# Patient Record
Sex: Male | Born: 1980 | Race: White | Hispanic: No | Marital: Single | Smoking: Current every day smoker
Health system: Southern US, Community
[De-identification: ages and names within clinical notes are randomized; demographics above are authoritative.]

## PROBLEM LIST (undated history)

## (undated) DIAGNOSIS — S322XXA Fracture of coccyx, initial encounter for closed fracture: Secondary | ICD-10-CM

---

## 2000-01-27 ENCOUNTER — Emergency Department (HOSPITAL_COMMUNITY): Admission: EM | Admit: 2000-01-27 | Discharge: 2000-01-27 | Payer: Self-pay | Admitting: Emergency Medicine

## 2001-01-21 ENCOUNTER — Emergency Department (HOSPITAL_COMMUNITY): Admission: EM | Admit: 2001-01-21 | Discharge: 2001-01-21 | Payer: Self-pay | Admitting: Emergency Medicine

## 2001-01-21 ENCOUNTER — Encounter: Payer: Self-pay | Admitting: Emergency Medicine

## 2002-09-19 ENCOUNTER — Encounter: Payer: Self-pay | Admitting: Emergency Medicine

## 2002-09-19 ENCOUNTER — Emergency Department (HOSPITAL_COMMUNITY): Admission: EM | Admit: 2002-09-19 | Discharge: 2002-09-19 | Payer: Self-pay

## 2003-04-10 ENCOUNTER — Emergency Department (HOSPITAL_COMMUNITY): Admission: EM | Admit: 2003-04-10 | Discharge: 2003-04-10 | Payer: Self-pay | Admitting: Emergency Medicine

## 2003-12-04 ENCOUNTER — Emergency Department (HOSPITAL_COMMUNITY): Admission: EM | Admit: 2003-12-04 | Discharge: 2003-12-04 | Payer: Self-pay | Admitting: Emergency Medicine

## 2004-04-26 ENCOUNTER — Emergency Department (HOSPITAL_COMMUNITY): Admission: AD | Admit: 2004-04-26 | Discharge: 2004-04-26 | Payer: Self-pay | Admitting: Family Medicine

## 2004-10-05 ENCOUNTER — Emergency Department (HOSPITAL_COMMUNITY): Admission: EM | Admit: 2004-10-05 | Discharge: 2004-10-05 | Payer: Self-pay | Admitting: Emergency Medicine

## 2004-12-23 ENCOUNTER — Emergency Department (HOSPITAL_COMMUNITY): Admission: EM | Admit: 2004-12-23 | Discharge: 2004-12-23 | Payer: Self-pay | Admitting: Emergency Medicine

## 2005-04-21 ENCOUNTER — Emergency Department (HOSPITAL_COMMUNITY): Admission: EM | Admit: 2005-04-21 | Discharge: 2005-04-21 | Payer: Self-pay | Admitting: Family Medicine

## 2005-09-15 ENCOUNTER — Emergency Department (HOSPITAL_COMMUNITY): Admission: EM | Admit: 2005-09-15 | Discharge: 2005-09-15 | Payer: Self-pay | Admitting: Family Medicine

## 2007-12-24 ENCOUNTER — Emergency Department (HOSPITAL_COMMUNITY): Admission: EM | Admit: 2007-12-24 | Discharge: 2007-12-24 | Payer: Self-pay | Admitting: Emergency Medicine

## 2008-02-02 ENCOUNTER — Emergency Department (HOSPITAL_COMMUNITY): Admission: EM | Admit: 2008-02-02 | Discharge: 2008-02-03 | Payer: Self-pay | Admitting: Emergency Medicine

## 2010-02-05 ENCOUNTER — Emergency Department (HOSPITAL_COMMUNITY): Admission: EM | Admit: 2010-02-05 | Discharge: 2010-02-05 | Payer: Self-pay | Admitting: Emergency Medicine

## 2011-04-11 LAB — CBC
HCT: 45.3
Hemoglobin: 15.5
MCHC: 34.2
MCV: 89.6
RBC: 5.05

## 2011-04-11 LAB — DIFFERENTIAL
Basophils Relative: 1
Eosinophils Absolute: 0.4
Eosinophils Relative: 3
Monocytes Absolute: 1.1 — ABNORMAL HIGH
Monocytes Relative: 8

## 2011-04-11 LAB — POCT I-STAT, CHEM 8
Calcium, Ion: 1.22
Glucose, Bld: 88
HCT: 48
Hemoglobin: 16.3

## 2011-07-19 ENCOUNTER — Emergency Department (INDEPENDENT_AMBULATORY_CARE_PROVIDER_SITE_OTHER)
Admission: EM | Admit: 2011-07-19 | Discharge: 2011-07-19 | Disposition: A | Discharge status: Home / Self Care | Payer: Self-pay | Source: Home / Self Care | Attending: Emergency Medicine | Admitting: Emergency Medicine

## 2011-07-19 ENCOUNTER — Encounter (HOSPITAL_COMMUNITY): Payer: Self-pay | Admitting: Emergency Medicine

## 2011-07-19 ENCOUNTER — Emergency Department (INDEPENDENT_AMBULATORY_CARE_PROVIDER_SITE_OTHER): Payer: Self-pay

## 2011-07-19 DIAGNOSIS — S93409A Sprain of unspecified ligament of unspecified ankle, initial encounter: Secondary | ICD-10-CM

## 2011-07-19 DIAGNOSIS — S93401A Sprain of unspecified ligament of right ankle, initial encounter: Secondary | ICD-10-CM

## 2011-07-19 MED ORDER — OXYCODONE-ACETAMINOPHEN 5-325 MG PO TABS: 1.0000 | ORAL_TABLET | Freq: Once | ORAL | Status: DC

## 2011-07-19 MED ORDER — OXYCODONE-ACETAMINOPHEN 5-325 MG PO TABS
2.0000 | ORAL_TABLET | ORAL | Status: AC | PRN
Start: 2011-07-19 — End: 2011-07-29

## 2011-07-19 NOTE — ED Notes (Signed)
Per pt injured right ankle last night going down steps missed step ankle rolled - swelling and increased pain - unable to put wt on foot - mild abrasion anterior ankle

## 2011-07-19 NOTE — ED Provider Notes (Signed)
History     CSN: [id number]  Arrival date & time 07/19/11  4098   First MD Initiated Contact with Patient 07/19/11 1007      Chief Complaint  Patient presents with  . Ankle Pain    (Consider location/radiation/quality/duration/timing/severity/associated sxs/prior treatment) HPI Comments: Derek Scott injured his right ankle last night. He twisted it. He did not hear a pop, but it immediately swelled up he's been unable to bear weight since then. He has pain and swelling over the lateral malleolus. He notes some numbness and tingling over the dorsum of the foot. No muscle weakness.  Patient is a 31 y.o. male presenting with ankle pain.  Ankle Pain  Pertinent negatives include no numbness.    History reviewed. No pertinent past medical history.  History reviewed. No pertinent past surgical history.  History reviewed. No pertinent family history.  History  Substance Use Topics  . Smoking status: Current Everyday Smoker  . Smokeless tobacco: Not on file  . Alcohol Use: No      Review of Systems  Musculoskeletal: Positive for joint swelling and arthralgias. Negative for myalgias, back pain and gait problem.  Skin: Negative for rash and wound.  Neurological: Negative for weakness and numbness.    Allergies  Hydrocodone  Home Medications   Current Outpatient Rx  Name Route Sig Dispense Refill  . OXYCODONE-ACETAMINOPHEN 5-325 MG PO TABS Oral Take 2 tablets by mouth every 4 (four) hours as needed for pain. 20 tablet 0    BP 137/86  Pulse 73  Temp(Src) 97.5 F (36.4 C) (Oral)  Resp 20  SpO2 100%  Physical Exam  Nursing note and vitals reviewed. Constitutional: He is oriented to person, place, and time. He appears well-developed and well-nourished. No distress.  Musculoskeletal: Normal range of motion. He exhibits edema and tenderness.       Exam of the ankle reveals swelling and exquisite tenderness to palpation over the lateral malleolus. There is no obvious deformity  or bruising. The ankle has very limited range of motion with pain. Sign was negative. Pulses were full. Good capillary refill. Normal strength and sensation.  Neurological: He is alert and oriented to person, place, and time. He has normal strength and normal reflexes. He displays no atrophy. No sensory deficit. He exhibits normal muscle tone.  Skin: Skin is warm and dry. No rash noted. He is not diaphoretic.    ED Course  Procedures (including critical care time)  Labs Reviewed - No data to display Dg Ankle Complete Right  07/19/2011  *RADIOLOGY REPORT*  Clinical Data: Severe right ankle pain/injury  RIGHT ANKLE - COMPLETE 3+ VIEW  Comparison: None.  Findings: No fracture or dislocation is seen.  The ankle mortise is intact.  The base of the fifth metatarsal is unremarkable.  Moderate lateral soft tissue swelling.  IMPRESSION: No fracture or dislocation is seen.  Moderate lateral soft tissue swelling.  Original Report Authenticated By: Charline Bills, M.D.     1. Sprain of right ankle       MDM  The patient has an ankle sprain. He was placed in an ASO and given crutches. He was told to followup with an orthopedist if no better in 2 weeks.        Roque Lias, MD 07/19/11 [phone]

## 2011-11-10 ENCOUNTER — Emergency Department (HOSPITAL_COMMUNITY): Payer: Medicaid Other

## 2011-11-10 ENCOUNTER — Emergency Department (HOSPITAL_COMMUNITY)
Admission: EM | Admit: 2011-11-10 | Discharge: 2011-11-10 | Disposition: A | Discharge status: Home / Self Care | Payer: Medicaid Other | Attending: Emergency Medicine | Admitting: Emergency Medicine

## 2011-11-10 ENCOUNTER — Encounter (HOSPITAL_COMMUNITY): Payer: Self-pay | Admitting: Emergency Medicine

## 2011-11-10 DIAGNOSIS — M25579 Pain in unspecified ankle and joints of unspecified foot: Secondary | ICD-10-CM | POA: Insufficient documentation

## 2011-11-10 DIAGNOSIS — S93409A Sprain of unspecified ligament of unspecified ankle, initial encounter: Secondary | ICD-10-CM | POA: Insufficient documentation

## 2011-11-10 DIAGNOSIS — IMO0001 Reserved for inherently not codable concepts without codable children: Secondary | ICD-10-CM | POA: Insufficient documentation

## 2011-11-10 DIAGNOSIS — X58XXXA Exposure to other specified factors, initial encounter: Secondary | ICD-10-CM | POA: Insufficient documentation

## 2011-11-10 DIAGNOSIS — F17200 Nicotine dependence, unspecified, uncomplicated: Secondary | ICD-10-CM | POA: Insufficient documentation

## 2011-11-10 HISTORY — DX: Fracture of coccyx, initial encounter for closed fracture: S32.2XXA

## 2011-11-10 MED ORDER — OXYCODONE-ACETAMINOPHEN 5-325 MG PO TABS
1.0000 | ORAL_TABLET | Freq: Four times a day (QID) | ORAL | Status: AC | PRN
Start: 2011-11-10 — End: 2011-11-20

## 2011-11-10 MED ORDER — IBUPROFEN 800 MG PO TABS: 800.0000 mg | ORAL_TABLET | Freq: Three times a day (TID) | ORAL | Status: AC | PRN

## 2011-11-10 NOTE — ED Notes (Signed)
Pt reports job related injury 3 months. R/ankle"swollen at time of injury"." No xrays done". pain and intermittent swelling persist

## 2011-11-10 NOTE — Discharge Instructions (Signed)
Your x-rays were normal. Return here as needed. Ice and heat to your ankle. You still need to see Dr. Madelon Lips in follow up about your ankle.

## 2011-11-10 NOTE — Progress Notes (Signed)
Pt listed as self pay with no insurance coverage Pt confirms he is self pay [place] resident CM and [facility] community liaison spoke with him Pt offered [facility] services to assist with finding a [place] self pay provider Pt pending medicaid

## 2011-11-10 NOTE — ED Provider Notes (Signed)
History     CSN: [id number]  Arrival date & time 11/10/11  1106   First MD Initiated Contact with Patient 11/10/11 1127      Chief Complaint  Patient presents with  . Ankle Pain    r/ ankle pain and swelling 3 months after injury    (Consider location/radiation/quality/duration/timing/severity/associated sxs/prior treatment) HPI  The patient presents to the ER with continued ankle pain from an injury 3 months ago. The patient states that he was seen at [facility] and told he had a sprain. 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 The patient denies weakness, numbness, or ambulation issues. Past Medical History  Diagnosis Date  . Fractured coccyx     4 years ago    History reviewed. No pertinent past surgical history.  Family History  Problem Relation Age of Onset  . Hypertension Mother   . Diabetes Mother   . Cancer Mother     History  Substance Use Topics  . Smoking status: Current Everyday Smoker    Types: Cigarettes  . Smokeless tobacco: Not on file  . Alcohol Use: No      Review of Systems All other systems negative except as documented in the HPI. All pertinent positives and negatives as reviewed in the HPI.   Allergies  Hydrocodone  Home Medications   Current Outpatient Rx  Name Route Sig Dispense Refill  . ACETAMINOPHEN 325 MG PO TABS Oral Take 975 mg by mouth every 6 (six) hours as needed. For pain.    Marlin Canary HEADACHE PO Oral Take 3-4 packets by mouth every 2 (two) hours as needed. For pain.    . ADULT MULTIVITAMIN W/MINERALS CH Oral Take 1 tablet by mouth daily.      BP 131/89  Pulse 100  Temp(Src) 98 F (36.7 C) (Oral)  Resp 16  SpO2 100%  Physical Exam Physical Examination: General appearance - alert, well appearing, and in no distress, oriented to person, place, and time and normal appearing weight Chest - clear to auscultation, no  wheezes, rales or rhonchi, symmetric air entry Heart - normal rate, regular rhythm, normal S1, S2, no murmurs, rubs, clicks or gallops Musculoskeletal - abnormal exam of right ankle with pain with movement and palpation. There is no swelling noted to the ankle. Patient has normal sensation and strength in the ankle and foot. Skin - normal coloration and turgor, no rashes, no suspicious skin lesions noted  ED Course  Procedures (including critical care time)  Labs Reviewed - No data to display Dg Ankle Complete Right  11/10/2011  *RADIOLOGY REPORT*  Clinical Data: Lateral ankle pain.  RIGHT ANKLE - COMPLETE 3+ VIEW  Comparison: 07/19/2011  Findings: No acute bony abnormality.  Specifically, no fracture, subluxation, or dislocation.  Soft tissues are intact.  IMPRESSION: No acute bony abnormality.  Original Report Authenticated By: Cyndie Chime, M.D.    The patient has an ortho appointment in the next 10 days. Told to ice and elevate. Return here as needed.     MDM  MDM Reviewed: nursing note and vitals Interpretation: x-ray            Carlyle Dolly, PA-C 11/10/11 1249

## 2011-11-10 NOTE — ED Provider Notes (Signed)
Medical screening examination/treatment/procedure(s) were performed by non-physician practitioner and as supervising physician I was immediately available for consultation/collaboration.  Guled Gahan, MD 11/10/11 1554 

## 2011-12-10 ENCOUNTER — Encounter (HOSPITAL_COMMUNITY): Payer: Self-pay | Admitting: Emergency Medicine

## 2011-12-10 ENCOUNTER — Emergency Department (HOSPITAL_COMMUNITY)
Admission: EM | Admit: 2011-12-10 | Discharge: 2011-12-11 | Disposition: A | Discharge status: Home / Self Care | Payer: Medicaid Other | Attending: Emergency Medicine | Admitting: Emergency Medicine

## 2011-12-10 DIAGNOSIS — F17200 Nicotine dependence, unspecified, uncomplicated: Secondary | ICD-10-CM | POA: Insufficient documentation

## 2011-12-10 DIAGNOSIS — M25579 Pain in unspecified ankle and joints of unspecified foot: Secondary | ICD-10-CM | POA: Insufficient documentation

## 2011-12-10 DIAGNOSIS — K1379 Other lesions of oral mucosa: Secondary | ICD-10-CM

## 2011-12-10 DIAGNOSIS — G8929 Other chronic pain: Secondary | ICD-10-CM

## 2011-12-10 NOTE — ED Notes (Signed)
Pt states he had an injury to his foot 3 mths ago and was seen here and received a referral to [facility] to Dr Madelon Lips but they would not accept the referral as it has to be from a primary care doctor and pt does not have one  Pt states he was seen at [facility] a couple days ago and was told he had a saliva gland blocked and pt is still having pain and would like that rechecked as well

## 2011-12-10 NOTE — ED Provider Notes (Signed)
History     CSN: [id number]  Arrival date & time 12/10/11  2041   First MD Initiated Contact with Patient 12/10/11 2329      Chief Complaint  Patient presents with  . Foot Pain    (Consider location/radiation/quality/duration/timing/severity/associated sxs/prior treatment) Patient is a 31 y.o. male presenting with lower extremity pain. The history is provided by the patient. No language interpreter was used.  Foot Pain This is a chronic problem. The current episode started more than 1 month ago. The problem occurs constantly. The problem has been gradually worsening. Associated symptoms include joint swelling. Pertinent negatives include no fever, nausea, numbness, vomiting or weakness. The symptoms are aggravated by walking.  Injury to R ankle 3` months ago still hurting.  Unable to follow up with ortho because no [facility] card or pcp. + cms to ankle and foot.  11/10/11 films show no fracture.   Requesting pain meds. Not using crutches today.  States that he can not use them at his job.   Also c/o pain in his L cheek.  No swelling or edema noted.  Prior salivary gland infection.  No palpable stone.  No acute distress.   Past Medical History  Diagnosis Date  . Fractured coccyx     4 years ago    History reviewed. No pertinent past surgical history.  Family History  Problem Relation Age of Onset  . Hypertension Mother   . Diabetes Mother   . Cancer Mother     History  Substance Use Topics  . Smoking status: Current Everyday Smoker    Types: Cigarettes  . Smokeless tobacco: Not on file  . Alcohol Use: No      Review of Systems  Constitutional: Negative.  Negative for fever.  HENT: Negative.  Negative for facial swelling.        C/o R cheek pain 2/10.  Eyes: Negative.   Respiratory: Negative.   Cardiovascular: Negative.   Gastrointestinal: Negative.  Negative for nausea and vomiting.  Musculoskeletal: Positive for joint swelling.       R ankle  Neurological: Negative.   Negative for weakness and numbness.  Psychiatric/Behavioral: Negative.   All other systems reviewed and are negative.    Allergies  Hydrocodone  Home Medications   Current Outpatient Rx  Name Route Sig Dispense Refill  . ACETAMINOPHEN 325 MG PO TABS Oral Take 975 mg by mouth every 6 (six) hours as needed. For pain.    Marlin Canary HEADACHE PO Oral Take 3-4 packets by mouth every 2 (two) hours as needed. For pain.    . ADULT MULTIVITAMIN W/MINERALS CH Oral Take 1 tablet by mouth daily.      BP 133/79  Pulse 91  Temp(Src) 98.3 F (36.8 C) (Oral)  Resp 20  SpO2 98%  Physical Exam  Nursing note and vitals reviewed. Constitutional: He is oriented to person, place, and time. He appears well-developed and well-nourished.  HENT:  Head: Normocephalic.  Eyes: Conjunctivae and EOM are normal. Pupils are equal, round, and reactive to light.  Neck: Normal range of motion. Neck supple.  Cardiovascular: Normal rate.   Pulmonary/Chest: Effort normal.  Abdominal: Soft.  Musculoskeletal: Normal range of motion. He exhibits edema and tenderness.       R ankle tenderness.  aso on the ankle prior to exam.  +cms  Neurological: He is alert and oriented to person, place, and time.  Skin: Skin is warm and dry.  Psychiatric: He has a normal mood and affect.  ED Course  Procedures (including critical care time)  Labs Reviewed - No data to display No results found.   No diagnosis found.    MDM  R ankle pain with prior - films.  ASO from last time he was here.  Did not follow up with ortho.  Told him we can not keep giving pain med in ER.  Will give rx for a few percocet and ibuprofen 800mg .  Also rx for pcn if pain and swelling in R cheek worse.  Patient states that he will get ortho follow up and pcp as soon as his [facility] care is ready this week.          Remi Haggard, NP 12/11/11 1505

## 2011-12-11 MED ORDER — IBUPROFEN 800 MG PO TABS: 800.0000 mg | ORAL_TABLET | Freq: Three times a day (TID) | ORAL | Status: AC

## 2011-12-11 MED ORDER — OXYCODONE-ACETAMINOPHEN 5-325 MG PO TABS: 2.0000 | ORAL_TABLET | ORAL | Status: AC | PRN

## 2011-12-11 MED ORDER — OXYCODONE-ACETAMINOPHEN 5-325 MG PO TABS: 1.0000 | ORAL_TABLET | Freq: Once | ORAL | Status: DC

## 2011-12-11 MED ORDER — PENICILLIN V POTASSIUM 500 MG PO TABS: 500.0000 mg | ORAL_TABLET | Freq: Four times a day (QID) | ORAL | Status: AC

## 2011-12-11 NOTE — Discharge Instructions (Signed)
Mr Galentine keep pursueing your orange card and get to the orthopedic for your ankle pain.  Hold the antibiotic for your mouth a few days to see if there is an infection. I suggest you use the crutches as much as possible to rest your ankle.  Take the narcotic pain med as needed but do not work or drive with it.   Chronic Ankle Instability with Rehab Chronic ankle instability is characterized by instability of the ankle for a prolonged period of time. There are two types of ankle instability.   A functionally unstable ankle is one that gives way; however, it may or may not be loose.   A mechanically unstable ankle is one that is loose due to a problem with the ligaments. However, not all loose ankles are unstable or give way.  SYMPTOMS   Recurrent ankle pain and giving way of the ankle.   Difficulty running on uneven surfaces, jumping, or changing directions while running (cutting).   Pain, tenderness, swelling, and bruising at the site of injury.   Weakness or looseness in the ankle joint.   Occasionally, impaired ability to walk soon after injury.  CAUSES   Ankle instability is most commonly caused by a previous ankle injury that did not completely heal.   Ankle instability may also be caused by stress imposed from either side of the ankle joint that can temporarily force or pry the ankle bone (talus) out of its normal alignment. The ligaments that hold the joint in place are stretched and torn.  RISK INCREASES WITH:  Previous ankle injury.   You were born with (congenital) joint looseness.   Too-rapid return to activity after previous ankle sprain.   Activities in which the foot may land sideways while running, walking, and jumping (basketball, volleyball, or soccer) or walking or running on uneven or rough surfaces.   Inadequate ankle support during athletics.   Poor strength and flexibility.   Poor balance skills.  PREVENTION  Warm up and stretch properly before  activity.   Maintain physical fitness:   Ankle and leg flexibility, muscle strength, and endurance.   Balanced training activities.   Cardiovascular fitness.   Learn and use proper technique during sports and have a coach correct improper technique.   Taping, protective strapping, bracing, or high-top tennis shoes may be used. Initially, tape is best; however, it loses most of its support function within 10 to 15 minutes.   Wear proper protective shoes (high-top shoes with taping or bracing).   Provide the ankle with support during sports and practice activities for 12 months following injury.   Complete rehabilitation after initial injury.  PROGNOSIS  If treated properly, ankle instability normally resolves with non-surgical treatment. However, for certain cases of mechanical instability surgery is necessary. RELATED COMPLICATIONS   Frequent recurrence of symptoms is possible. Following rehabilitation guidelines correctly decreases the frequency of recurrence and optimizes healing time.   Injury to other structures (bone, cartilage, or tendon).   Chronically unstable or arthritic ankle joint.   Complications of surgery including infection, bleeding, injury to nerves, continued giving way, ankle stiffness, and ankle weakness.  TREATMENT Treatment initially involves ice, medication, and compression bandages are used to help reduce pain and inflammation, It may be necessary to immobilize the joint for a period of time to allow for healing. Strengthening and stretching exercises are recommended after immobilization to help regain strength and flexibility. These exercises may be completed at home or with a therapist. Some individuals find placing  a heel wedge in the shoe, taping or bracing, and wearing high-top shoes helpful. If symptoms last for longer than 3 months, despite treatment, then surgery may be recommended. HEAT AND COLD  Cold treatment (icing) relieves pain and reduces  inflammation. Cold treatment should be applied for 10 to 15 minutes every 2 to 3 hours for inflammation and pain and immediately after any activity that aggravates your symptoms. Use ice packs or an ice massage.   Heat treatment may be used prior to performing the stretching and strengthening activities prescribed by your caregiver, physical therapist, or athletic trainer. Use a heat pack or a warm soak.  MEDICATION   There are no specific medications to improve the stability of your ankle.   If pain medication is necessary, then nonsteroidal anti-inflammatory medications, such as aspirin and ibuprofen, or other minor pain relievers, such as acetaminophen, are often recommended.   Do not take pain medication within 7 days before surgery.   Prescription pain relievers may be prescribed if deemed necessary by your caregiver. Use only as directed and only as much as you need.   Ointments applied to the skin may be helpful.  SEEK MEDICAL CARE IF:   Pain, swelling, or bruising worsens despite treatment.   You develop locking or catching in the ankle.   You have pain, numbness, or coldness in the foot.   You develop giving way of the ankle which persists after 3 to 6 months of rehabilitation.  EXERCISES  RANGE OF MOTION AND STRETCHING EXERCISES - Ankle Instability, Chronic, Non-Surgical Intervention Since ankles demonstrate instability when they have too much motion throughout the joints, range of motion and stretching exercises are not helpful and can even be harmful. Only complete range of motion and stretching exercises for your ankle if instructed by your physician, physical therapist or athletic trainer. An effective rehabilitation program for unstable ankles will include mostly strengthening and balance exercises. STRENGTHENING EXERCISES - Ankle Instability, Chronic, Non-Surgical Intervention  These exercises may help you when beginning to rehabilitate your injury. They may resolve your  symptoms with or without further involvement from your physician, physical therapist or athletic trainer. While completing these exercises, remember:   Muscles can gain both the endurance and the strength needed for everyday activities through controlled exercises.   Complete these exercises as instructed by your physician, physical therapist or athletic trainer. Progress the resistance and repetitions only as guided.   You may experience muscle soreness or fatigue, but the pain or discomfort you are trying to eliminate should never worsen during these exercises. If this pain does worsen, stop and make certain you are following the directions exactly. If the pain is still present after adjustments, discontinue the exercise until you can discuss the trouble with your clinician.  STRENGTH - Dorsiflexors  Secure a rubber exercise band/tubing to a fixed object (table, pole) and loop the other end around your right / left foot.   Sit on the floor facing the fixed object. The band/tubing should be slightly tense when your foot is relaxed.   Slowly draw your foot back toward you using your ankle and toes.   Hold this position for __________ seconds. Slowly release the tension in the band and return your foot to the starting position.  Repeat __________ times. Complete this exercise __________ times per day.  STRENGTH - Plantar-flexors  Sit with your right / left leg extended. Holding onto both ends of a rubber exercise band/tubing, loop it around the ball of your foot.  Keep a slight tension in the band.   Slowly push your toes away from you, pointing them downward.   Hold this position for __________ seconds. Return slowly, controlling the tension in the band/tubing.  Repeat __________ times. Complete this exercise __________ times per day.  STRENGTH - Plantar-flexors, Standing   Stand with your feet shoulder width apart. Steady yourself with a wall or table using as little support as needed.    Keeping your weight evenly spread over the width of your feet, rise up on your toes.*   Hold this position for __________ seconds.  Repeat __________ times. Complete this exercise __________ times per day.  *If this is too easy, shift your weight toward your right / left leg until you feel challenged. Ultimately, you may be asked to do this exercise with your right / left foot only. STRENGTH - Ankle Eversion  Secure one end of a rubber exercise band/tubing to a fixed object (table, pole). Loop the other end around your foot just before your toes.   Place your fists between your knees. This will focus your strengthening at your ankle.   Drawing the band/tubing across your opposite foot, slowly, pull your little toe out and up. Make sure the band/tubing is positioned to resist the entire motion.   Hold this position for __________ seconds.   Have your muscles resist the band/tubing as it slowly pulls your foot back to the starting position.  Repeat __________ times. Complete this exercise __________ times per day.  STRENGTH - Ankle Inversion  Secure one end of a rubber exercise band/tubing to a fixed object (table, pole). Loop the other end around your foot just before your toes.   Place your fists between your knees. This will focus your strengthening at your ankle.   Slowly, pull your big toe up and in, making sure the band/tubing is positioned to resist the entire motion.   Hold this position for __________ seconds.   Have your muscles resist the band/tubing as it slowly pulls your foot back to the starting position.  Repeat __________ times. Complete this exercises __________ times per day.  STRENGTH - Towel Curls  Sit in a chair positioned on a non-carpeted surface.   Place your foot on a towel, keeping your heel on the floor.   Pull the towel toward your heel by only curling your toes. Keep your heel on the floor.   If instructed by your physician, physical therapist or  athletic trainer, add weight to the end of the towel.  Repeat __________ times. Complete this exercise __________ times per day. STRENGTH - Dorsiflexors and Plantar-flexors, Heel/toe Walking  Dorsiflexion: Walk on your heels only. Keep your toes as high as possible.   Repeat __________ times. Complete __________ times per day.   Plantar flexion: Walk on your toes only. Keep your heels as high as possible.   Walk for ____________________ seconds/feet.  Repeat __________ times. Complete __________ times per day.  BALANCE - Tandem Walking  Place your uninjured foot on a line 2-4 inches wide and at least 10 feet long.   Keeping your balance without using anything for extra support, place your right / left heel directly in front of your other foot.   Slowly raise your back foot up, lifting from the heel to the toes, and place it directly in front of the right / left foot.   Continue to walk along the line slowly. Walk for ____________________ feet.  Repeat ____________________ times. Complete ____________________ times per  day. BALANCE - Inversion/Eversion Use caution, these are advanced level exercises. Do not begin them until you are advised to do so.   Create a balance board using a sturdy board about 1  feet long and at 1-1  feet wide and a 1  inch diameter rod or pipe that is as long as the board's width. A copper pipe or a solid broomstick work well.   Stand on a non-carpeted surface near a countertop or wall. Step onto the board so that your feet are hip-width apart and equally straddle the rod/pipe.   Keeping your feet in place, complete these two exercises without shifting your upper body or hips:   Tip the board from side-to-side. Control the movement so the board does not forcefully strike the ground. The board should silently tap the ground.   Tip the board side-to-side without striking the ground. Occasionally pause and maintain a steady position at various points.    Repeat the first two exercises, but use only your right / left foot. Place your right / left foot directly over the rod/pipe.  Repeat __________ times. Complete this exercise __________ times a day. BALANCE - Plantar/Dorsi Flexion Use caution, these are advanced level exercises. Do not begin them until you are advised to do so.  Create a balance board using a sturdy board about 1  feet long and at 1-1  feet wide and a 1  inch diameter rod or pipe that is as long as the board's width. A copper pipe or a solid broomstick work well.   Stand on a non-carpeted surface near a countertop or wall. Stand on the board so that the rod/pipe runs under the arches in your feet.   Keeping your feet in place, complete these two exercises without shifting your upper body or hips:   Tip the board from side-to-side. Control the movement so the board does not forcefully strike the ground. The board should silently tap the ground.   Tip the board side-to-side without striking the ground. Occasionally pause and maintain a steady position at various points.   Repeat the first two exercises, but use only your right / left foot. Stand in the center of the board.  Repeat __________ times. Complete this exercise __________ times a day. STRENGTH - Plantar-flexors, Eccentric Note: This exercise can place a lot of stress on your foot and ankle. Please complete this exercise only if specifically instructed by your caregiver.   Place the balls of your feet on a step. With your hands, use only enough support from a wall or rail to keep your balance.   Keep your knees straight and rise up on your toes.   Slowly shift your weight entirely to your toes and pick up your opposite foot. Gently and with controlled movement, lower your weight through your right / left foot so that your heel drops below the level of the step. You will feel a slight stretch in the back of your calf at the ending position.   Use the healthy  leg to help rise up onto the balls of both feet, then lower weight only on the right / left leg again. Build up to 15 repetitions. Then progress to 3 consecutive sets of 15 repetitions.*   After completing the above exercise, complete the same exercise with a slight knee bend (about 30 degrees). Again, build up to 15 repetitions. Then progress to 3 consecutive sets of 15 repetitions.*  Perform this exercise __________ times per day.  *  When you easily complete 3 sets of 15, your physician, physical therapist or athletic trainer may advise you to add resistance by wearing a backpack filled with additional weight. Document Released: 01/29/2005 Document Revised: 06/19/2011 Document Reviewed: 10/12/2008 [facility] Patient Information 2012 [place], [place].Chronic Ankle Instability with Rehab Chronic ankle instability is characterized by instability of the ankle for a prolonged period of time. There are two types of ankle instability.   A functionally unstable ankle is one that gives way; however, it may or may not be loose.   A mechanically unstable ankle is one that is loose due to a problem with the ligaments. However, not all loose ankles are unstable or give way.  SYMPTOMS   Recurrent ankle pain and giving way of the ankle.   Difficulty running on uneven surfaces, jumping, or changing directions while running (cutting).   Pain, tenderness, swelling, and bruising at the site of injury.   Weakness or looseness in the ankle joint.   Occasionally, impaired ability to walk soon after injury.  CAUSES   Ankle instability is most commonly caused by a previous ankle injury that did not completely heal.   Ankle instability may also be caused by stress imposed from either side of the ankle joint that can temporarily force or pry the ankle bone (talus) out of its normal alignment. The ligaments that hold the joint in place are stretched and torn.  RISK INCREASES WITH:  Previous ankle injury.   You  were born with (congenital) joint looseness.   Too-rapid return to activity after previous ankle sprain.   Activities in which the foot may land sideways while running, walking, and jumping (basketball, volleyball, or soccer) or walking or running on uneven or rough surfaces.   Inadequate ankle support during athletics.   Poor strength and flexibility.   Poor balance skills.  PREVENTION  Warm up and stretch properly before activity.   Maintain physical fitness:   Ankle and leg flexibility, muscle strength, and endurance.   Balanced training activities.   Cardiovascular fitness.   Learn and use proper technique during sports and have a coach correct improper technique.   Taping, protective strapping, bracing, or high-top tennis shoes may be used. Initially, tape is best; however, it loses most of its support function within 10 to 15 minutes.   Wear proper protective shoes (high-top shoes with taping or bracing).   Provide the ankle with support during sports and practice activities for 12 months following injury.   Complete rehabilitation after initial injury.  PROGNOSIS  If treated properly, ankle instability normally resolves with non-surgical treatment. However, for certain cases of mechanical instability surgery is necessary. RELATED COMPLICATIONS   Frequent recurrence of symptoms is possible. Following rehabilitation guidelines correctly decreases the frequency of recurrence and optimizes healing time.   Injury to other structures (bone, cartilage, or tendon).   Chronically unstable or arthritic ankle joint.   Complications of surgery including infection, bleeding, injury to nerves, continued giving way, ankle stiffness, and ankle weakness.  TREATMENT Treatment initially involves ice, medication, and compression bandages are used to help reduce pain and inflammation, It may be necessary to immobilize the joint for a period of time to allow for healing. Strengthening  and stretching exercises are recommended after immobilization to help regain strength and flexibility. These exercises may be completed at home or with a therapist. Some individuals find placing a heel wedge in the shoe, taping or bracing, and wearing high-top shoes helpful. If symptoms last for longer than 3  months, despite treatment, then surgery may be recommended. HEAT AND COLD  Cold treatment (icing) relieves pain and reduces inflammation. Cold treatment should be applied for 10 to 15 minutes every 2 to 3 hours for inflammation and pain and immediately after any activity that aggravates your symptoms. Use ice packs or an ice massage.   Heat treatment may be used prior to performing the stretching and strengthening activities prescribed by your caregiver, physical therapist, or athletic trainer. Use a heat pack or a warm soak.  MEDICATION   There are no specific medications to improve the stability of your ankle.   If pain medication is necessary, then nonsteroidal anti-inflammatory medications, such as aspirin and ibuprofen, or other minor pain relievers, such as acetaminophen, are often recommended.   Do not take pain medication within 7 days before surgery.   Prescription pain relievers may be prescribed if deemed necessary by your caregiver. Use only as directed and only as much as you need.   Ointments applied to the skin may be helpful.  SEEK MEDICAL CARE IF:   Pain, swelling, or bruising worsens despite treatment.   You develop locking or catching in the ankle.   You have pain, numbness, or coldness in the foot.   You develop giving way of the ankle which persists after 3 to 6 months of rehabilitation.  EXERCISES  RANGE OF MOTION AND STRETCHING EXERCISES - Ankle Instability, Chronic, Non-Surgical Intervention Since ankles demonstrate instability when they have too much motion throughout the joints, range of motion and stretching exercises are not helpful and can even be  harmful. Only complete range of motion and stretching exercises for your ankle if instructed by your physician, physical therapist or athletic trainer. An effective rehabilitation program for unstable ankles will include mostly strengthening and balance exercises. STRENGTHENING EXERCISES - Ankle Instability, Chronic, Non-Surgical Intervention  These exercises may help you when beginning to rehabilitate your injury. They may resolve your symptoms with or without further involvement from your physician, physical therapist or athletic trainer. While completing these exercises, remember:   Muscles can gain both the endurance and the strength needed for everyday activities through controlled exercises.   Complete these exercises as instructed by your physician, physical therapist or athletic trainer. Progress the resistance and repetitions only as guided.   You may experience muscle soreness or fatigue, but the pain or discomfort you are trying to eliminate should never worsen during these exercises. If this pain does worsen, stop and make certain you are following the directions exactly. If the pain is still present after adjustments, discontinue the exercise until you can discuss the trouble with your clinician.  STRENGTH - Dorsiflexors  Secure a rubber exercise band/tubing to a fixed object (table, pole) and loop the other end around your right / left foot.   Sit on the floor facing the fixed object. The band/tubing should be slightly tense when your foot is relaxed.   Slowly draw your foot back toward you using your ankle and toes.   Hold this position for __________ seconds. Slowly release the tension in the band and return your foot to the starting position.  Repeat __________ times. Complete this exercise __________ times per day.  STRENGTH - Plantar-flexors  Sit with your right / left leg extended. Holding onto both ends of a rubber exercise band/tubing, loop it around the ball of your foot.  Keep a slight tension in the band.   Slowly push your toes away from you, pointing them downward.  Hold this position for __________ seconds. Return slowly, controlling the tension in the band/tubing.  Repeat __________ times. Complete this exercise __________ times per day.  STRENGTH - Plantar-flexors, Standing   Stand with your feet shoulder width apart. Steady yourself with a wall or table using as little support as needed.   Keeping your weight evenly spread over the width of your feet, rise up on your toes.*   Hold this position for __________ seconds.  Repeat __________ times. Complete this exercise __________ times per day.  *If this is too easy, shift your weight toward your right / left leg until you feel challenged. Ultimately, you may be asked to do this exercise with your right / left foot only. STRENGTH - Ankle Eversion  Secure one end of a rubber exercise band/tubing to a fixed object (table, pole). Loop the other end around your foot just before your toes.   Place your fists between your knees. This will focus your strengthening at your ankle.   Drawing the band/tubing across your opposite foot, slowly, pull your little toe out and up. Make sure the band/tubing is positioned to resist the entire motion.   Hold this position for __________ seconds.   Have your muscles resist the band/tubing as it slowly pulls your foot back to the starting position.  Repeat __________ times. Complete this exercise __________ times per day.  STRENGTH - Ankle Inversion  Secure one end of a rubber exercise band/tubing to a fixed object (table, pole). Loop the other end around your foot just before your toes.   Place your fists between your knees. This will focus your strengthening at your ankle.   Slowly, pull your big toe up and in, making sure the band/tubing is positioned to resist the entire motion.   Hold this position for __________ seconds.   Have your muscles resist the  band/tubing as it slowly pulls your foot back to the starting position.  Repeat __________ times. Complete this exercises __________ times per day.  STRENGTH - Towel Curls  Sit in a chair positioned on a non-carpeted surface.   Place your foot on a towel, keeping your heel on the floor.   Pull the towel toward your heel by only curling your toes. Keep your heel on the floor.   If instructed by your physician, physical therapist or athletic trainer, add weight to the end of the towel.  Repeat __________ times. Complete this exercise __________ times per day. STRENGTH - Dorsiflexors and Plantar-flexors, Heel/toe Walking  Dorsiflexion: Walk on your heels only. Keep your toes as high as possible.   Repeat __________ times. Complete __________ times per day.   Plantar flexion: Walk on your toes only. Keep your heels as high as possible.   Walk for ____________________ seconds/feet.  Repeat __________ times. Complete __________ times per day.  BALANCE - Tandem Walking  Place your uninjured foot on a line 2-4 inches wide and at least 10 feet long.   Keeping your balance without using anything for extra support, place your right / left heel directly in front of your other foot.   Slowly raise your back foot up, lifting from the heel to the toes, and place it directly in front of the right / left foot.   Continue to walk along the line slowly. Walk for ____________________ feet.  Repeat ____________________ times. Complete ____________________ times per day. BALANCE - Inversion/Eversion Use caution, these are advanced level exercises. Do not begin them until you are advised to do so.  Create a balance board using a sturdy board about 1  feet long and at 1-1  feet wide and a 1  inch diameter rod or pipe that is as long as the board's width. A copper pipe or a solid broomstick work well.   Stand on a non-carpeted surface near a countertop or wall. Step onto the board so that your feet  are hip-width apart and equally straddle the rod/pipe.   Keeping your feet in place, complete these two exercises without shifting your upper body or hips:   Tip the board from side-to-side. Control the movement so the board does not forcefully strike the ground. The board should silently tap the ground.   Tip the board side-to-side without striking the ground. Occasionally pause and maintain a steady position at various points.   Repeat the first two exercises, but use only your right / left foot. Place your right / left foot directly over the rod/pipe.  Repeat __________ times. Complete this exercise __________ times a day. BALANCE - Plantar/Dorsi Flexion Use caution, these are advanced level exercises. Do not begin them until you are advised to do so.  Create a balance board using a sturdy board about 1  feet long and at 1-1  feet wide and a 1  inch diameter rod or pipe that is as long as the board's width. A copper pipe or a solid broomstick work well.   Stand on a non-carpeted surface near a countertop or wall. Stand on the board so that the rod/pipe runs under the arches in your feet.   Keeping your feet in place, complete these two exercises without shifting your upper body or hips:   Tip the board from side-to-side. Control the movement so the board does not forcefully strike the ground. The board should silently tap the ground.   Tip the board side-to-side without striking the ground. Occasionally pause and maintain a steady position at various points.   Repeat the first two exercises, but use only your right / left foot. Stand in the center of the board.  Repeat __________ times. Complete this exercise __________ times a day. STRENGTH - Plantar-flexors, Eccentric Note: This exercise can place a lot of stress on your foot and ankle. Please complete this exercise only if specifically instructed by your caregiver.   Place the balls of your feet on a step. With your hands, use  only enough support from a wall or rail to keep your balance.   Keep your knees straight and rise up on your toes.   Slowly shift your weight entirely to your toes and pick up your opposite foot. Gently and with controlled movement, lower your weight through your right / left foot so that your heel drops below the level of the step. You will feel a slight stretch in the back of your calf at the ending position.   Use the healthy leg to help rise up onto the balls of both feet, then lower weight only on the right / left leg again. Build up to 15 repetitions. Then progress to 3 consecutive sets of 15 repetitions.*   After completing the above exercise, complete the same exercise with a slight knee bend (about 30 degrees). Again, build up to 15 repetitions. Then progress to 3 consecutive sets of 15 repetitions.*  Perform this exercise __________ times per day.  *When you easily complete 3 sets of 15, your physician, physical therapist or athletic trainer may advise you to add resistance by wearing a  backpack filled with additional weight. Document Released: 01/29/2005 Document Revised: 06/19/2011 Document Reviewed: 10/12/2008 [facility], [facility] Patient Information 2012 [place], [place].

## 2011-12-12 NOTE — ED Provider Notes (Signed)
Medical screening examination/treatment/procedure(s) were performed by non-physician practitioner and as supervising physician I was immediately available for consultation/collaboration.   Rayvin Abid M Zane Samson, MD 12/12/11 2245 

## 2012-01-04 ENCOUNTER — Emergency Department (HOSPITAL_COMMUNITY)
Admission: EM | Admit: 2012-01-04 | Discharge: 2012-01-05 | Disposition: A | Discharge status: Home / Self Care | Payer: Medicaid Other | Attending: Emergency Medicine | Admitting: Emergency Medicine

## 2012-01-04 ENCOUNTER — Encounter (HOSPITAL_COMMUNITY): Payer: Self-pay | Admitting: Emergency Medicine

## 2012-01-04 DIAGNOSIS — Z885 Allergy status to narcotic agent status: Secondary | ICD-10-CM | POA: Insufficient documentation

## 2012-01-04 DIAGNOSIS — Z8249 Family history of ischemic heart disease and other diseases of the circulatory system: Secondary | ICD-10-CM | POA: Insufficient documentation

## 2012-01-04 DIAGNOSIS — M25579 Pain in unspecified ankle and joints of unspecified foot: Secondary | ICD-10-CM | POA: Insufficient documentation

## 2012-01-04 DIAGNOSIS — G8929 Other chronic pain: Secondary | ICD-10-CM | POA: Insufficient documentation

## 2012-01-04 DIAGNOSIS — Z833 Family history of diabetes mellitus: Secondary | ICD-10-CM | POA: Insufficient documentation

## 2012-01-04 DIAGNOSIS — Z809 Family history of malignant neoplasm, unspecified: Secondary | ICD-10-CM | POA: Insufficient documentation

## 2012-01-04 DIAGNOSIS — F17200 Nicotine dependence, unspecified, uncomplicated: Secondary | ICD-10-CM | POA: Insufficient documentation

## 2012-01-04 NOTE — ED Notes (Signed)
Pt alert, nad, c/o right ankle pain, chronic in nature, denies recent trauma or injury, ambulates to triage, steady gait noted

## 2012-01-05 MED ORDER — METHOCARBAMOL 500 MG PO TABS: 500.0000 mg | ORAL_TABLET | Freq: Two times a day (BID) | ORAL | Status: AC

## 2012-01-05 MED ORDER — DICLOFENAC SODIUM 75 MG PO TBEC: 75.0000 mg | DELAYED_RELEASE_TABLET | Freq: Two times a day (BID) | ORAL | Status: DC

## 2012-01-05 NOTE — Discharge Instructions (Signed)
Ankle Pain  Ankle pain is a common symptom. The bones, cartilage, tendons, and muscles of the ankle joint perform a lot of work each day. The ankle joint holds your body weight and allows you to move around. Ankle pain can occur on either side or back of 1 or both ankles. Ankle pain may be sharp and burning or dull and aching. There may be tenderness, stiffness, redness, or warmth around the ankle. The pain occurs more often when a person walks or puts pressure on the ankle.  CAUSES   There are many reasons ankle pain can develop. It is important to work with your caregiver to identify the cause since many conditions can impact the bones, cartilage, muscles, and tendons. Causes for ankle pain include:  · Injury, including a break (fracture), sprain, or strain often due to a fall, sports, or a high-impact activity.  · Swelling (inflammation) of a tendon (tendonitis).  · Achilles tendon rupture.  · Ankle instability after repeated sprains and strains.  · Poor foot alignment.  · Pressure on a nerve (tarsal tunnel syndrome).  · Arthritis in the ankle or the lining of the ankle.  · Crystal formation in the ankle (gout or pseudogout).  DIAGNOSIS   A diagnosis is based on your medical history, your symptoms, results of your physical exam, and results of diagnostic tests. Diagnostic tests may include X-ray exams or a computerized magnetic scan (magnetic resonance imaging, MRI).  TREATMENT   Treatment will depend on the cause of your ankle pain and may include:  · Keeping pressure off the ankle and limiting activities.  · Using crutches or other walking support (a cane or brace).  · Using rest, ice, compression, and elevation.  · Participating in physical therapy or home exercises.  · Wearing shoe inserts or special shoes.  · Losing weight.  · Taking medications to reduce pain or swelling or receiving an injection.  · Undergoing surgery.  HOME CARE INSTRUCTIONS   · Only take over-the-counter or prescription medicines for  pain, discomfort, or fever as directed by your caregiver.  · Put ice on the injured area.  · Put ice in a plastic bag.  · Place a towel between your skin and the bag.  · Leave the ice on for 15 to 20 minutes at a time, 3 to 4 times a day.  · Keep your leg raised (elevated) when possible to lessen swelling.  · Avoid activities that cause ankle pain.  · Follow specific exercises as directed by your caregiver.  · Record how often you have ankle pain, the location of the pain, and what it feels like. This information may be helpful to you and your caregiver.  · Ask your caregiver about returning to work or sports and whether you should drive.  · Follow up with your caregiver for further examination, therapy, or testing as directed.  SEEK MEDICAL CARE IF:   · Pain or swelling continues or worsens beyond 1 week.  · You have an oral temperature above 102° F (38.9° C).  · You are feeling unwell or have chills.  · You are having an increasingly difficult time with walking.  · You have loss of sensation or other new symptoms.  · You have questions or concerns.  MAKE SURE YOU:   · Understand these instructions.  · Will watch your condition.  · Will get help right away if you are not doing well or get worse.  Document Released: 12/18/2009 Document Revised: 06/19/2011 Document   Reviewed: 12/18/2009  [employer]® Patient Information ©2012 [employer].

## 2012-01-05 NOTE — ED Provider Notes (Signed)
Medical screening examination/treatment/procedure(s) were performed by non-physician practitioner and as supervising physician I was immediately available for consultation/collaboration.   Gerhard Munch, MD 01/05/12 [phone]

## 2012-01-05 NOTE — ED Provider Notes (Signed)
History     CSN: [id number]  Arrival date & time 01/04/12  2259   First MD Initiated Contact with Patient 01/04/12 2328     12:20 AM Patient reports chronic right ankle pain for 6 months after an ankle fracture. Reports at that time did not have insurance and was unable to have followup. Reports recently obtained Medicaid. States however most recent ankle pain has been worsening at night. Reports persistent swelling of his right ankle. Reports he works 15 hours a day and is unable to take off. Denies new injury. Patient is a 31 y.o. male presenting with ankle pain. The history is provided by the patient.  Ankle Pain  Incident onset: 6 months. The pain is present in the left ankle. The quality of the pain is described as aching. The pain is moderate. The pain has been constant since onset. Pertinent negatives include no numbness, no inability to bear weight, no loss of motion, no muscle weakness, no loss of sensation and no tingling. The symptoms are aggravated by activity, bearing weight and palpation. He has tried rest and NSAIDs for the symptoms. The treatment provided mild relief.    Past Medical History  Diagnosis Date  . Fractured coccyx     4 years ago    History reviewed. No pertinent past surgical history.  Family History  Problem Relation Age of Onset  . Hypertension Mother   . Diabetes Mother   . Cancer Mother     History  Substance Use Topics  . Smoking status: Current Everyday Smoker    Types: Cigarettes  . Smokeless tobacco: Not on file  . Alcohol Use: No      Review of Systems  Musculoskeletal:       Ankle pain  Neurological: Negative for tingling and numbness.  All other systems reviewed and are negative.    Allergies  Hydrocodone  Home Medications  No current outpatient prescriptions on file.  BP 143/81  Pulse 86  Temp 98 F (36.7 C)  Resp 16  SpO2 100%  Physical Exam  Constitutional: He is oriented to person, place, and time. He appears  well-developed and well-nourished.  HENT:  Head: Normocephalic and atraumatic.  Eyes: Pupils are equal, round, and reactive to light.  Musculoskeletal:       Right ankle: He exhibits decreased range of motion. He exhibits no swelling, no laceration and normal pulse. tenderness. Lateral malleolus, medial malleolus and AITFL tenderness found. Achilles tendon normal.  Neurological: He is alert and oriented to person, place, and time.  Skin: Skin is warm and dry. No rash noted. No erythema. No pallor.  Psychiatric: He has a normal mood and affect. His behavior is normal.    ED Course  Procedures   MDM  Patient has a splint at home with pain medication and referral to Dr. Magnus Ivan. Advised he needs physical therapy or further evaluation for ligament pain. Patient voices understanding and is ready for discharge      Thomasene Lot, Cordelia Poche 01/05/12 4098

## 2012-01-12 ENCOUNTER — Emergency Department (HOSPITAL_COMMUNITY)
Admission: EM | Admit: 2012-01-12 | Discharge: 2012-01-12 | Disposition: A | Discharge status: Home / Self Care | Payer: Medicaid Other | Source: Home / Self Care | Attending: Family Medicine | Admitting: Family Medicine

## 2012-01-12 ENCOUNTER — Encounter (HOSPITAL_COMMUNITY): Payer: Self-pay | Admitting: Emergency Medicine

## 2012-01-12 DIAGNOSIS — L03818 Cellulitis of other sites: Secondary | ICD-10-CM

## 2012-01-12 DIAGNOSIS — L03811 Cellulitis of head [any part, except face]: Secondary | ICD-10-CM

## 2012-01-12 MED ORDER — DOXYCYCLINE HYCLATE 100 MG PO CAPS: 100.0000 mg | ORAL_CAPSULE | Freq: Two times a day (BID) | ORAL | Status: AC

## 2012-01-12 MED ORDER — MUPIROCIN CALCIUM 2 % EX CREA: TOPICAL_CREAM | Freq: Three times a day (TID) | CUTANEOUS | Status: AC

## 2012-01-12 NOTE — ED Notes (Signed)
PT HERE WITH SCABBED LACERATION TO L SIDE OF EAR FROM CUT X 5 DYS AGO,YELLOW PUS DRAINAGE AND NOW FEELING MALAISE.NASAL CONGESTION,H/A AND CHILLS.PT TRIED ALCOHOL,PEROXIDE AND ATB OINTMENT BUT NO RELIEF

## 2012-01-12 NOTE — ED Provider Notes (Signed)
History     CSN: [id number]  Arrival date & time 01/12/12  1748   First MD Initiated Contact with Patient 01/12/12 1801      Chief Complaint  Patient presents with  . Head Laceration    (Consider location/radiation/quality/duration/timing/severity/associated sxs/prior treatment) Patient is a 31 y.o. male presenting with scalp laceration. The history is provided by the patient.  Head Laceration This is a new problem. The current episode started more than 2 days ago (uncertain of exact injury, reports getting worse as scrape above left ear on scalp.). The problem has been gradually worsening. Associated symptoms include headaches.    Past Medical History  Diagnosis Date  . Fractured coccyx     4 years ago    History reviewed. No pertinent past surgical history.  Family History  Problem Relation Age of Onset  . Hypertension Mother   . Diabetes Mother   . Cancer Mother     History  Substance Use Topics  . Smoking status: Current Everyday Smoker    Types: Cigarettes  . Smokeless tobacco: Not on file  . Alcohol Use: No      Review of Systems  HENT: Positive for congestion and rhinorrhea.   Skin: Positive for wound.  Neurological: Positive for headaches.    Allergies  Hydrocodone  Home Medications   Current Outpatient Rx  Name Route Sig Dispense Refill  . DICLOFENAC SODIUM 75 MG PO TBEC Oral Take 1 tablet (75 mg total) by mouth 2 (two) times daily. 30 tablet 0  . DOXYCYCLINE HYCLATE 100 MG PO CAPS Oral Take 1 capsule (100 mg total) by mouth 2 (two) times daily. 20 capsule 0  . METHOCARBAMOL 500 MG PO TABS Oral Take 1 tablet (500 mg total) by mouth 2 (two) times daily. 20 tablet 0  . MUPIROCIN CALCIUM 2 % EX CREA Topical Apply topically 3 (three) times daily. 15 g 0    BP 143/87  Pulse 72  Temp 98.4 F (36.9 C) (Oral)  Resp 18  SpO2 98%  Physical Exam  Nursing note and vitals reviewed. Constitutional: He is oriented to person, place, and time. He  appears well-developed and well-nourished. He appears distressed.  HENT:  Head: Normocephalic.       1.2 cm circular eschar above left pinna on scalp with tender, erythema, with purulent drainage. Eschar removed, cleansed with betadine.  Neurological: He is alert and oriented to person, place, and time.  Skin: Skin is warm and dry.    ED Course  Procedures (including critical care time)   Labs Reviewed  WOUND CULTURE   No results found.   1. Cellulitis of scalp       MDM          Linna Hoff, MD 01/12/12 [phone]

## 2012-01-12 NOTE — Discharge Instructions (Signed)
Warm compress twice a day when you take the antibiotic and apply ointment. take all of medicine, return as needed.

## 2012-01-15 LAB — WOUND CULTURE

## 2012-01-16 ENCOUNTER — Telehealth (HOSPITAL_COMMUNITY): Payer: Self-pay | Admitting: *Deleted

## 2012-01-16 NOTE — ED Notes (Signed)
Pt. called back and verified x 2. Pt. given result and told he was adequately treated. Pt. instructed to finish all of medication and given MRSA instructions.  Pt. voiced understanding. Vassie Moselle 01/16/2012

## 2012-01-16 NOTE — ED Notes (Signed)
Wound culture head: Abundant MRSA.  Pt. adequately treated with Doxycycline. I called pt. and left a message to call. Vassie Moselle 01/16/2012

## 2012-02-02 ENCOUNTER — Emergency Department (HOSPITAL_COMMUNITY): Payer: Medicaid Other

## 2012-02-02 ENCOUNTER — Other Ambulatory Visit: Payer: Self-pay

## 2012-02-02 ENCOUNTER — Emergency Department (HOSPITAL_COMMUNITY)
Admission: EM | Admit: 2012-02-02 | Discharge: 2012-02-02 | Disposition: A | Discharge status: Home / Self Care | Payer: Medicaid Other | Attending: Emergency Medicine | Admitting: Emergency Medicine

## 2012-02-02 ENCOUNTER — Encounter (HOSPITAL_COMMUNITY): Payer: Self-pay | Admitting: *Deleted

## 2012-02-02 DIAGNOSIS — R071 Chest pain on breathing: Secondary | ICD-10-CM | POA: Insufficient documentation

## 2012-02-02 DIAGNOSIS — R0602 Shortness of breath: Secondary | ICD-10-CM | POA: Insufficient documentation

## 2012-02-02 DIAGNOSIS — R0789 Other chest pain: Secondary | ICD-10-CM

## 2012-02-02 DIAGNOSIS — F17200 Nicotine dependence, unspecified, uncomplicated: Secondary | ICD-10-CM | POA: Insufficient documentation

## 2012-02-02 DIAGNOSIS — R0682 Tachypnea, not elsewhere classified: Secondary | ICD-10-CM | POA: Insufficient documentation

## 2012-02-02 LAB — CBC WITH DIFFERENTIAL/PLATELET
Eosinophils Relative: 3 % (ref 0–5)
HCT: 47.4 % (ref 39.0–52.0)
Lymphocytes Relative: 13 % (ref 12–46)
Lymphs Abs: 1.6 10*3/uL (ref 0.7–4.0)
MCV: 87.9 fL (ref 78.0–100.0)
Monocytes Absolute: 0.8 10*3/uL (ref 0.1–1.0)
Neutro Abs: 9.9 10*3/uL — ABNORMAL HIGH (ref 1.7–7.7)
Platelets: 338 10*3/uL (ref 150–400)
RBC: 5.39 MIL/uL (ref 4.22–5.81)
WBC: 12.7 10*3/uL — ABNORMAL HIGH (ref 4.0–10.5)

## 2012-02-02 LAB — POCT I-STAT TROPONIN I: Troponin i, poc: 0 ng/mL (ref 0.00–0.08)

## 2012-02-02 LAB — COMPREHENSIVE METABOLIC PANEL
BUN: 6 mg/dL (ref 6–23)
Calcium: 9.1 mg/dL (ref 8.4–10.5)
GFR calc Af Amer: >90 mL/min (ref >90)
Glucose, Bld: 101 mg/dL — ABNORMAL HIGH (ref 70–99)
Sodium: 143 mEq/L (ref 135–145)
Total Protein: 6.2 g/dL (ref 6.0–8.3)

## 2012-02-02 MED ORDER — IBUPROFEN 800 MG PO TABS: 800.0000 mg | ORAL_TABLET | Freq: Three times a day (TID) | ORAL | Status: AC | PRN

## 2012-02-02 MED ORDER — SODIUM CHLORIDE 0.9 % IV BOLUS (SEPSIS)
1000.0000 mL | Freq: Once | INTRAVENOUS | Status: AC
  Administered 2012-02-02: 1000 mL via INTRAVENOUS

## 2012-02-02 MED ORDER — ASPIRIN 81 MG PO CHEW
324.0000 mg | CHEWABLE_TABLET | Freq: Once | ORAL | Status: AC
  Administered 2012-02-02: 324 mg via ORAL
  Filled 2012-02-02: qty 4

## 2012-02-02 MED ORDER — KETOROLAC TROMETHAMINE 30 MG/ML IJ SOLN
30.0000 mg | Freq: Once | INTRAMUSCULAR | Status: AC
  Administered 2012-02-02: 30 mg via INTRAVENOUS
  Filled 2012-02-02: qty 1

## 2012-02-02 MED ORDER — PERCOCET 5-325 MG PO TABS: 1.0000 | ORAL_TABLET | Freq: Four times a day (QID) | ORAL | Status: AC | PRN

## 2012-02-02 NOTE — ED Notes (Signed)
cmet recollected sent to lab

## 2012-02-02 NOTE — ED Provider Notes (Signed)
History     CSN: [id number]  Arrival date & time 02/02/12  1207   First MD Initiated Contact with Patient 02/02/12 1246      Chief Complaint  Patient presents with  . Chest Pain  . Shortness of Breath    (Consider location/radiation/quality/duration/timing/severity/associated sxs/prior treatment) HPI Patient presents emergency Dept. with bilateral, chest pain, that started last night.  He, states, that deep breathing, coughing laughing and Movements make the pain worse.  Patient denies, nausea, vomiting, diaphoresis, calf pain, hemoptysis, weakness, dizziness, syncope, or visual changes.  Patient, states he did not take anything prior to arrival for his discomfort.  Patient is a 2 pack a day smoker.  Patient denies any wheezing.  Patient, states the pain, does not radiate. Past Medical History  Diagnosis Date  . Fractured coccyx     4 years ago    History reviewed. No pertinent past surgical history.  Family History  Problem Relation Age of Onset  . Hypertension Mother   . Diabetes Mother   . Cancer Mother     History  Substance Use Topics  . Smoking status: Current Everyday Smoker    Types: Cigarettes  . Smokeless tobacco: Not on file  . Alcohol Use: No      Review of Systems All other systems negative except as documented in the HPI. All pertinent positives and negatives as reviewed in the HPI.  Allergies  Hydrocodone  Home Medications   Current Outpatient Rx  Name Route Sig Dispense Refill  . CLONAZEPAM 1 MG PO TABS Oral Take 1 mg by mouth 3 (three) times daily as needed. For anxiety/anger management      BP 136/68  Pulse 70  Temp 98.6 F (37 C) (Oral)  Resp 18  SpO2 100%  Physical Exam  Nursing note and vitals reviewed. Constitutional: He is oriented to person, place, and time. He appears well-developed and well-nourished. No distress.  HENT:  Head: Normocephalic and atraumatic.  Mouth/Throat: Oropharynx is clear and moist. No oropharyngeal  exudate.  Eyes: Pupils are equal, round, and reactive to light.  Neck: Normal range of motion. Neck supple.  Cardiovascular: Normal rate, regular rhythm and normal heart sounds.  Exam reveals no gallop and no friction rub.   No murmur heard. Pulmonary/Chest: Breath sounds normal. Tachypnea noted. No respiratory distress. He has no wheezes. He has no rales. He exhibits tenderness.  Neurological: He is alert and oriented to person, place, and time.  Skin: Skin is warm and dry.  Psychiatric: His mood appears anxious.    ED Course  Procedures (including critical care time)  Labs Reviewed  CBC WITH DIFFERENTIAL - Abnormal; Notable for the following:    WBC 12.7 (*)     Neutrophils Relative 78 (*)     Neutro Abs 9.9 (*)     All other components within normal limits  D-DIMER, QUANTITATIVE  POCT I-STAT TROPONIN I  COMPREHENSIVE METABOLIC PANEL   Dg Chest 2 View  02/02/2012  *RADIOLOGY REPORT*  Clinical Data: Chest pain and shortness of breath.  CHEST - 2 VIEW  Comparison: None.  Findings: Lungs are clear.  Heart size is normal.  No pneumothorax or pleural fluid.  IMPRESSION: Negative chest.  Original Report Authenticated By: Bernadene Bell. Maricela Curet, M.D.    Patient has a negative d-dimer, and low risk based on Wells criteria.  Patient has been sleeping in his room for the last hour, and, in no distress.  Patient has a negative chest x-ray and negative EKG  for any acute findings.  Patient is a smoker, but does not have any other cardiac risk factors.  Patient's pain is most likely related to chest wall pain, based on his history and physical exam findings.  MDM  MDM Reviewed: nursing note and vitals Interpretation: labs, ECG and x-ray    Date: 02/02/2012  Rate: 101  Rhythm: sinus tachycardia  QRS Axis: normal  Intervals: normal  ST/T Wave abnormalities: normal  Conduction Disutrbances:none  Narrative Interpretation:   Old EKG Reviewed: none available          Carlyle Dolly, PA-C 02/02/12 1648

## 2012-02-02 NOTE — Discharge Instructions (Signed)
Use heat on your chest wall. Return here as needed.

## 2012-02-02 NOTE — ED Notes (Signed)
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 Cbc and cmet was ok

## 2012-02-02 NOTE — ED Notes (Signed)
Pt is asleep

## 2012-02-02 NOTE — ED Notes (Signed)
Pt states he started to become sob and have chest pain last night. Pt states he was unable to sleep at night . Pt states he went to work today and started to have increase mid-sternum chest pain. Pt states pain increases with deep breath

## 2012-02-02 NOTE — ED Notes (Signed)
Lab called rn again stating cmet has hemolysis and they only have the cbc result. RN asked lab why was this not notified earlier because the cbc was result on at 12:51. Lab states they only have one machine and the cmet has hemolysis. This will be the 3 stick on pt

## 2012-02-21 NOTE — ED Provider Notes (Signed)
Medical screening examination/treatment/procedure(s) were performed by non-physician practitioner and as supervising physician I was immediately available for consultation/collaboration.   Suzi Roots, MD 02/21/12 [phone]

## 2012-03-13 ENCOUNTER — Encounter (HOSPITAL_COMMUNITY): Payer: Self-pay | Admitting: Emergency Medicine

## 2012-03-13 ENCOUNTER — Emergency Department (HOSPITAL_COMMUNITY)
Admission: EM | Admit: 2012-03-13 | Discharge: 2012-03-13 | Disposition: A | Discharge status: Home / Self Care | Payer: Medicaid Other | Attending: Emergency Medicine | Admitting: Emergency Medicine

## 2012-03-13 DIAGNOSIS — L03213 Periorbital cellulitis: Secondary | ICD-10-CM

## 2012-03-13 DIAGNOSIS — H00039 Abscess of eyelid unspecified eye, unspecified eyelid: Secondary | ICD-10-CM | POA: Insufficient documentation

## 2012-03-13 DIAGNOSIS — H5710 Ocular pain, unspecified eye: Secondary | ICD-10-CM | POA: Insufficient documentation

## 2012-03-13 DIAGNOSIS — H538 Other visual disturbances: Secondary | ICD-10-CM | POA: Insufficient documentation

## 2012-03-13 DIAGNOSIS — H53149 Visual discomfort, unspecified: Secondary | ICD-10-CM | POA: Insufficient documentation

## 2012-03-13 DIAGNOSIS — H5789 Other specified disorders of eye and adnexa: Secondary | ICD-10-CM | POA: Insufficient documentation

## 2012-03-13 MED ORDER — NAPHAZOLINE-PHENIRAMINE 0.025-0.3 % OP SOLN: 1.0000 [drp] | OPHTHALMIC | Status: AC | PRN

## 2012-03-13 MED ORDER — CLINDAMYCIN HCL 150 MG PO CAPS: ORAL_CAPSULE | ORAL | Status: AC

## 2012-03-13 MED ORDER — ERYTHROMYCIN 5 MG/GM OP OINT: TOPICAL_OINTMENT | OPHTHALMIC | Status: AC

## 2012-03-13 NOTE — ED Provider Notes (Signed)
History     CSN: [id number]  Arrival date & time 03/13/12  1635   First MD Initiated Contact with Patient 03/13/12 1714      Chief Complaint  Patient presents with  . Eye Pain    (Consider location/radiation/quality/duration/timing/severity/associated sxs/prior treatment) HPI Comments: Patient with a history of MRSApresents emergency department with chief complaint of left eye lid swelling.  Onset of symptoms began 4 days ago with initial eye irritation.gradually his lower leg began to swell and become erythematous.  Associated symptoms include mild blurred vision and photophobia.  Patient denies any change diplopia, ophthalmoplegia, pain with eye movements, fever, night sweats, chills, trauma to the eye, recent sinus infection or URI.  He has no other complaints at this time.    Patient is a 31 y.o. male presenting with eye pain. The history is provided by the patient.  Eye Pain Pertinent negatives include no congestion, coughing, diaphoresis, fever, headaches, myalgias or neck pain.    Past Medical History  Diagnosis Date  . Fractured coccyx     4 years ago    History reviewed. No pertinent past surgical history.  Family History  Problem Relation Age of Onset  . Hypertension Mother   . Diabetes Mother   . Cancer Mother     History  Substance Use Topics  . Smoking status: Current Everyday Smoker -- 2.0 packs/day    Types: Cigarettes  . Smokeless tobacco: Never Used  . Alcohol Use: No      Review of Systems  Constitutional: Negative for fever, diaphoresis and activity change.  HENT: Positive for facial swelling (lower lid). Negative for congestion and neck pain.   Eyes: Positive for redness. Negative for pain and discharge.  Respiratory: Negative for cough.   Genitourinary: Negative for dysuria.  Musculoskeletal: Negative for myalgias.  Skin: Negative for color change and wound.  Neurological: Negative for headaches.  All other systems reviewed and are  negative.    Allergies  Hydrocodone  Home Medications   Current Outpatient Rx  Name Route Sig Dispense Refill  . GOODY HEADACHE PO Oral Take 1 applicator by mouth every 4 (four) hours as needed. Pain    . CLONAZEPAM 1 MG PO TABS Oral Take 1 mg by mouth 3 (three) times daily as needed. For anxiety/anger management    . CYCLOBENZAPRINE HCL 10 MG PO TABS Oral Take 10 mg by mouth 3 (three) times daily as needed. Muscle spasm    . OXYCODONE-ACETAMINOPHEN 5-325 MG PO TABS Oral Take 1 tablet by mouth every 4 (four) hours as needed. Pain    . CLINDAMYCIN HCL 150 MG PO CAPS  Take 300 MG PO TID x 7 days 42 capsule 0  . ERYTHROMYCIN 5 MG/GM OP OINT  Place a 1/2 inch ribbon of ointment into the lower eyelid. 1 g 0  . NAPHAZOLINE-PHENIRAMINE 0.025-0.3 % OP SOLN Left Eye Place 1 drop into the left eye every 4 (four) hours as needed. 5 mL 0    BP 135/65  Pulse 101  Temp 98.3 F (36.8 C) (Oral)  Resp 20  SpO2 98%  Physical Exam  Nursing note and vitals reviewed. Constitutional: He is oriented to person, place, and time. He appears well-developed and well-nourished. No distress.  HENT:  Head: Normocephalic and atraumatic.  Eyes: Conjunctivae and EOM are normal. Pupils are equal, round, and reactive to light. No foreign body present in the left eye.       Lower lid swelling and erythema. No tenderness to palpation  over temporal arteries or orbital region. Pain free EOMs, visual acuity assesed, no increase in IOPs, no proptosis, hyphema, purulent discharge from eyes, or consensual photophobia.  Eyelids everted, no evidence of FB.  Fluorescein study: Small area of corneal uptake at 6o'clock, but no dendritic pattern    Neck: Normal range of motion. Neck supple.  Pulmonary/Chest: Effort normal.  Neurological: He is alert and oriented to person, place, and time.  Skin: Skin is warm and dry. No rash noted. He is not diaphoretic.  Psychiatric: His behavior is normal.    ED Course  Procedures  (including critical care time)  Labs Reviewed - No data to display No results found.   1. Periorbital cellulitis       MDM  Periorbital cellulitis  Patient presents emergency department with chief complaint of lower lid swelling.  Exam non-concerning for orbital cellulitis because there was no tenderness to palpation, diplopia, ophthalmoplegia, proptosis or entrapment.  Patient discharged with clindamycin 300 mg 3 times a day x7 days.  Fluorescein exam showed mild uptake less than 3 mm large.  Patient will be given a rhythm is an appointment for this.  Strict return precautions discussed including return to the emergency department if no improvement in lip swelling after 24 hours of treatment.  Patient agrees with this plan.  Recommended ophthalmologic all followup on Tuesday.  Patient in no acute distress prior to discharge.       Jaci Carrel, [place] 03/13/12 1921

## 2012-03-13 NOTE — ED Notes (Signed)
Pt endorses positive Hx for MRSA and has multiple scars to show for it. Now it developing inflammed, swollen, and reddened area to left lower eye lid. Onset of initial irritation was Wednesday.

## 2012-03-13 NOTE — ED Notes (Signed)
Patient with swelling to left eye.  Patient reports this is how his MRSA infections start.  Pt reporting intense pain to area.  Vision is blurry.  Discharge from eye.

## 2012-03-13 NOTE — ED Provider Notes (Signed)
Medical screening examination/treatment/procedure(s) were performed by non-physician practitioner and as supervising physician I was immediately available for consultation/collaboration.   Marwan T Powers, MD 03/13/12 2317 

## 2012-03-29 ENCOUNTER — Encounter: Payer: Self-pay | Admitting: *Deleted

## 2012-03-29 ENCOUNTER — Ambulatory Visit (INDEPENDENT_AMBULATORY_CARE_PROVIDER_SITE_OTHER): Payer: Medicaid Other | Admitting: Internal Medicine

## 2012-03-29 ENCOUNTER — Encounter: Payer: Self-pay | Admitting: Internal Medicine

## 2012-03-29 VITALS — BP 134/78 | HR 76 | Temp 98.5°F | Ht 73.0 in | Wt 194.0 lb

## 2012-03-29 DIAGNOSIS — Z22322 Carrier or suspected carrier of Methicillin resistant Staphylococcus aureus: Secondary | ICD-10-CM

## 2012-03-29 MED ORDER — CHLORHEXIDINE GLUCONATE 2 % EX SOLN: 15.0000 mL | Freq: Every day | CUTANEOUS | Status: DC

## 2012-03-29 MED ORDER — RIFAMPIN 300 MG PO CAPS: 300.0000 mg | ORAL_CAPSULE | Freq: Two times a day (BID) | ORAL | Status: DC

## 2012-03-29 MED ORDER — MUPIROCIN 2 % EX OINT: TOPICAL_OINTMENT | CUTANEOUS | Status: AC

## 2012-03-29 MED ORDER — SULFAMETHOXAZOLE-TMP DS 800-160 MG PO TABS: 2.0000 | ORAL_TABLET | Freq: Two times a day (BID) | ORAL | Status: DC

## 2012-03-29 NOTE — Progress Notes (Signed)
INFECTIOUS DISEASES CLINIC  RFV: recurrent MRSA skin infection Subjective:    Patient ID: Derek Scott, male    DOB: October 31, 1980, 31 y.o.   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 He reports having 4th episode this year. HE mostly has lesions to head and neck where he has them lanced plus a course of doxycyline or clindamycin. He reports having lesions in Jan, mar, April. On Aug.31st, the patient presented to the ED with periorbital cellulitis. Pain and blurry vision. He reports that his vision is not back to baseline. Now still alittle bit blurry per the patient. He finished a 10day course of clindamycin where he had marked improvement. He reports that they used to not be as frequent. He previously only had 1 episode per year. He is referred to the [facility] clinic for further management. At this time, he does not have any active lesions. He states that he thinks he feels like he has a lesion on the inside tip of his left nares.   Current Outpatient Prescriptions on File Prior to Visit  Medication Sig Dispense Refill  . Aspirin-Acetaminophen-Caffeine (GOODY HEADACHE PO) Take 1 applicator by mouth every 4 (four) hours as needed. Pain      . clonazePAM (KLONOPIN) 1 MG tablet Take 1 mg by mouth 3 (three) times daily as needed. For anxiety/anger management      . cyclobenzaprine (FLEXERIL) 10 MG tablet Take 10 mg by mouth 3 (three) times daily as needed. Muscle spasm      . oxyCODONE-acetaminophen (PERCOCET/ROXICET) 5-325 MG per tablet Take 1 tablet by mouth every 4 (four) hours as needed. Pain       Active Ambulatory Problems    Diagnosis Date Noted  . No Active Ambulatory Problems   Resolved Ambulatory Problems    Diagnosis Date Noted  . No Resolved Ambulatory Problems   Past Medical History  Diagnosis Date  . Fractured coccyx    Social hx: lives with fiance and 2 daughters, 78 and 89 yo old. He works as a  Secretary/administrator.  Off of work at presents. No smokes 2 1/2 ppd. Quit etoh age.   Family hx: GF died of lung cancer; CAD  Review of Systems  Constitutional: Negative for fever, chills, diaphoresis, activity change, appetite change, fatigue and unexpected weight change.  HENT: Negative for congestion, sore throat, rhinorrhea, sneezing, trouble swallowing and sinus pressure.  Eyes: mild blurry vision.Negative for photophobia.  Respiratory: Negative for cough, chest tightness, shortness of breath, wheezing and stridor.  Cardiovascular: Negative for chest pain, palpitations and leg swelling.  Gastrointestinal: Negative for nausea, vomiting, abdominal pain, diarrhea, constipation, blood in stool, abdominal distention and anal bleeding.  Genitourinary: Negative for dysuria, hematuria, flank pain and difficulty urinating.  Musculoskeletal: Negative for myalgias, back pain, joint swelling, arthralgias and gait problem.  Skin: Negative for color change, pallor, rash and wound.  Neurological: Negative for dizziness, tremors, weakness and light-headedness.  Hematological: Negative for adenopathy. Does not bruise/bleed easily.  Psychiatric/Behavioral: Negative for behavioral problems, confusion, sleep disturbance, dysphoric mood, decreased concentration and agitation.      Objective:   Physical Exam BP 134/78  Pulse 76  Temp 98.5 F (36.9 C) (Oral)  Ht 6\' 1"  (1.854 m)  Wt 194 lb (87.998 kg)  BMI 25.60 kg/m2. Physical Exam  Constitutional: He is oriented to person, place, and time. He appears well-developed and well-nourished. No distress.  HENT: [place]/AT, PERRLA, EOMI, no conjunctivitis.  No palpebral swelling. No visual lesions in nares Mouth/Throat: Oropharynx is clear and moist. No oropharyngeal exudate.  Cardiovascular: Normal rate, regular rhythm and normal heart sounds. Exam reveals no gallop and no friction rub. No murmur heard.  Pulmonary/Chest: Effort normal and breath sounds normal. No respiratory distress.  He has no wheezes.  Abdominal: Soft. Bowel sounds are normal. He exhibits no distension. There is no tenderness.  Lymphadenopathy:  no cervical adenopathy.  Neurological: He is alert and oriented to person, place, and time.  Skin: Skin is warm and dry. No rash noted. No erythema.  Psychiatric: He has a normal mood and affect. His behavior is normal.      Assessment & Plan:  MRSA decolonization = discussed that the patient may benefit from decolonization process to minimize risks of reoccurrence. Will do bactrim DS 2 BID, rifampin 300mg  BID, mupirocin  TID, chlorohexedine body wash daily x 10 days  Blurry vision = recommend follow up visit with ophthomology to ensure no sequelae of from periorbital cellulitis.  rtc as needed

## 2012-03-30 ENCOUNTER — Encounter: Payer: Self-pay | Admitting: *Deleted

## 2012-03-30 NOTE — Progress Notes (Signed)
Patient ID: Derek Scott, male   DOB: [birth year], 31 y.o.   MRN: [id number] Pt seen yesterday, 03/29/12 for MRSA infection.  Pt requesting letter to return-to-work with MRSA.  RN will send request to Dr. Drue Second.

## 2012-04-01 ENCOUNTER — Telehealth: Payer: Self-pay

## 2012-04-01 MED ORDER — CHLORHEXIDINE GLUCONATE 4 % EX LIQD: 1.0000 "application " | Freq: Every day | CUTANEOUS | Status: DC | PRN

## 2012-04-01 NOTE — Progress Notes (Signed)
Ok to give letter for returning to work

## 2012-04-01 NOTE — Telephone Encounter (Signed)
Chlorhexidine gluconate comes only in 4 %.  Is it ok to change? Pharmacy given permission to change .  Laurell Josephs, RN

## 2012-04-02 ENCOUNTER — Encounter: Payer: Self-pay | Admitting: *Deleted

## 2012-04-02 NOTE — Progress Notes (Signed)
Patient ID: Derek Scott, male   DOB: 09-13-1980, 31 y.o.   MRN: [id number] Will compose letter for the patient.

## 2013-01-18 IMAGING — CR DG CHEST 2V
2 series · 2 of 2 positions shown · non-contrast
Comparison: None.

CLINICAL DATA: Chest pain and shortness of breath.

CHEST - 2 VIEW

[w chest pa]
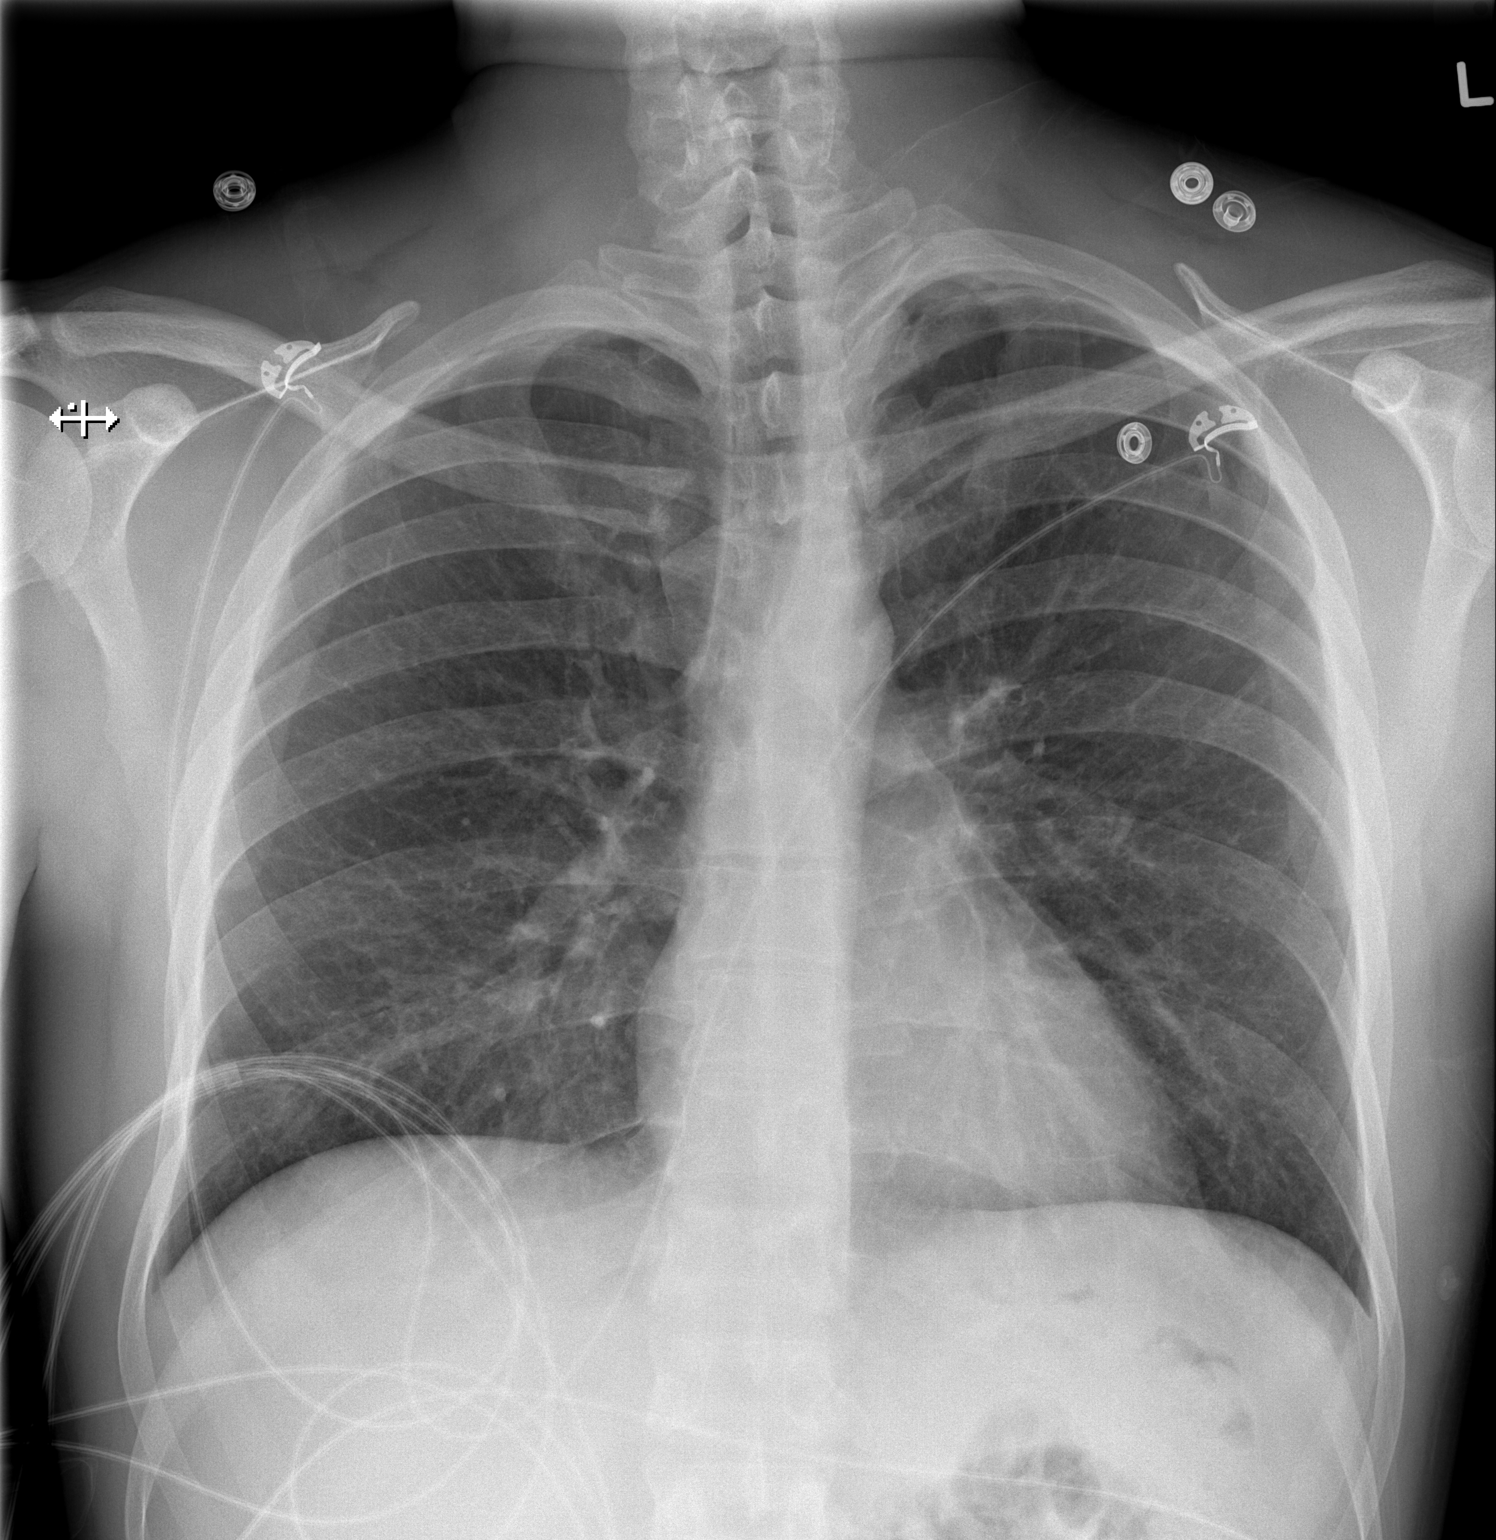

[w chest lat]
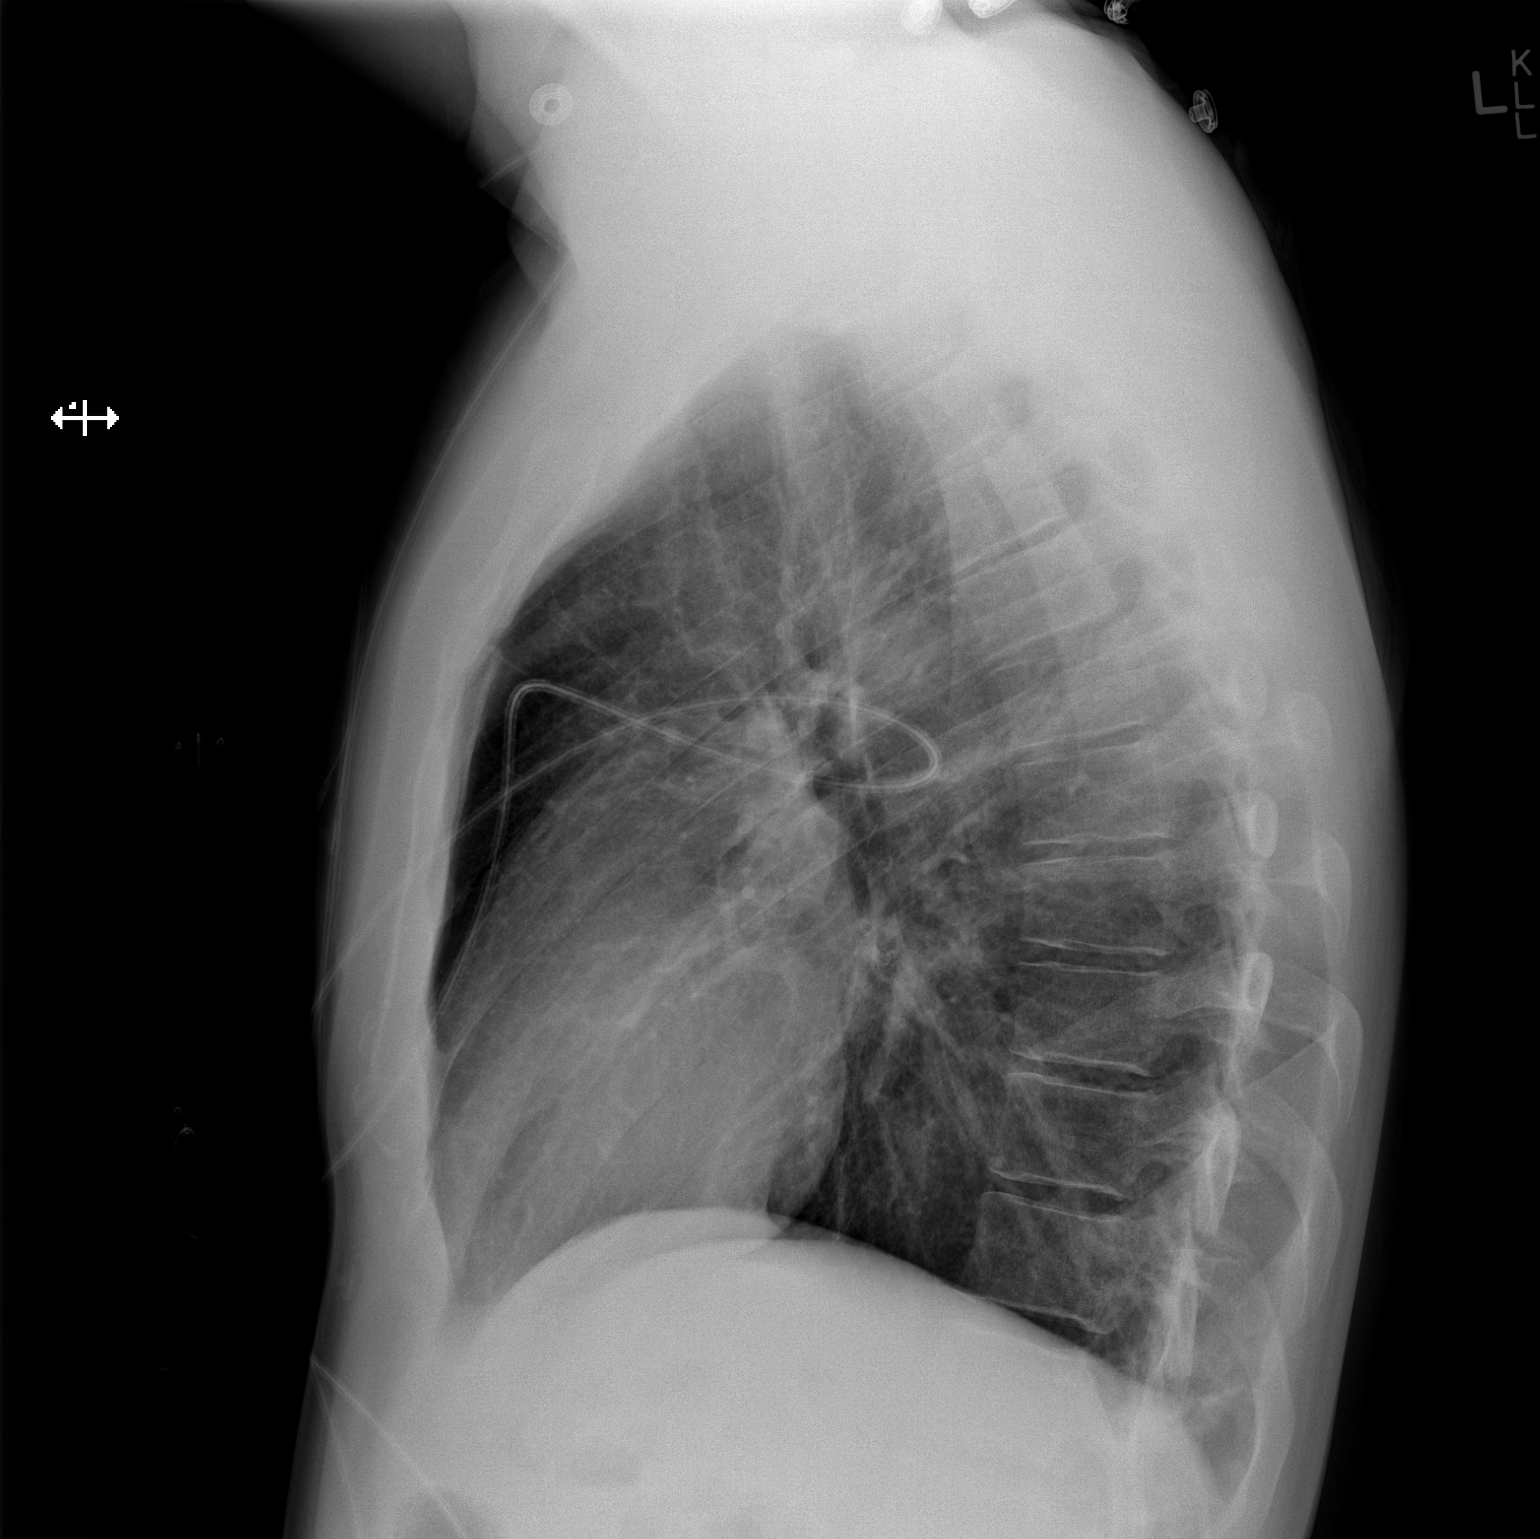

[2 of 2 positions shown; findings below may reference images not displayed]

FINDINGS: Lungs are clear.  Heart size is normal.  No pneumothorax
or pleural fluid.
IMPRESSION: Negative chest.

## 2013-10-25 ENCOUNTER — Encounter (HOSPITAL_COMMUNITY): Payer: Self-pay | Admitting: Emergency Medicine

## 2013-10-25 ENCOUNTER — Emergency Department (HOSPITAL_COMMUNITY)
Admission: EM | Admit: 2013-10-25 | Discharge: 2013-10-25 | Disposition: A | Discharge status: Home / Self Care | Payer: Medicaid Other | Attending: Emergency Medicine | Admitting: Emergency Medicine

## 2013-10-25 DIAGNOSIS — S61209A Unspecified open wound of unspecified finger without damage to nail, initial encounter: Secondary | ICD-10-CM | POA: Insufficient documentation

## 2013-10-25 DIAGNOSIS — Y9389 Activity, other specified: Secondary | ICD-10-CM | POA: Insufficient documentation

## 2013-10-25 DIAGNOSIS — Y929 Unspecified place or not applicable: Secondary | ICD-10-CM | POA: Insufficient documentation

## 2013-10-25 DIAGNOSIS — F17200 Nicotine dependence, unspecified, uncomplicated: Secondary | ICD-10-CM | POA: Insufficient documentation

## 2013-10-25 DIAGNOSIS — S61219A Laceration without foreign body of unspecified finger without damage to nail, initial encounter: Secondary | ICD-10-CM

## 2013-10-25 DIAGNOSIS — Z8781 Personal history of (healed) traumatic fracture: Secondary | ICD-10-CM | POA: Insufficient documentation

## 2013-10-25 DIAGNOSIS — W268XXA Contact with other sharp object(s), not elsewhere classified, initial encounter: Secondary | ICD-10-CM | POA: Insufficient documentation

## 2013-10-25 MED ORDER — OXYCODONE-ACETAMINOPHEN 5-325 MG PO TABS
1.0000 | ORAL_TABLET | Freq: Once | ORAL | Status: AC
  Administered 2013-10-25: 1 via ORAL
  Filled 2013-10-25: qty 1

## 2013-10-25 NOTE — ED Notes (Signed)
Pt reports cut r ring finger on piece of metal.  Bleeding controlled with pressure.

## 2013-10-25 NOTE — Discharge Instructions (Signed)
Laceration Care, Adult A laceration is a cut that goes through all layers of the skin. The cut goes into the tissue beneath the skin. HOME CARE For stitches (sutures) or staples:  Keep the cut clean and dry.  If you have a bandage (dressing), change it at least once a day. Change the bandage if it gets wet or dirty, or as told by your doctor.  Wash the cut with soap and water 2 times a day. Rinse the cut with water. Pat it dry with a clean towel.  Put a thin layer of medicated cream on the cut as told by your doctor.  You may shower after the first 24 hours. Do not soak the cut in water until the stitches are removed.  Only take medicines as told by your doctor.  Have your stitches or staples removed as told by your doctor. For skin adhesive strips:  Keep the cut clean and dry.  Do not get the strips wet. You may take a bath, but be careful to keep the cut dry.  If the cut gets wet, pat it dry with a clean towel.  The strips will fall off on their own. Do not remove the strips that are still stuck to the cut. For wound glue:  You may shower or take baths. Do not soak or scrub the cut. Do not swim. Avoid heavy sweating until the glue falls off on its own. After a shower or bath, pat the cut dry with a clean towel.  Do not put medicine on your cut until the glue falls off.  If you have a bandage, do not put tape over the glue.  Avoid lots of sunlight or tanning lamps until the glue falls off. Put sunscreen on the cut for the first year to reduce your scar.  The glue will fall off on its own. Do not pick at the glue. You may need a tetanus shot if:  You cannot remember when you had your last tetanus shot.  You have never had a tetanus shot. If you need a tetanus shot and you choose not to have one, you may get tetanus. Sickness from tetanus can be serious. GET HELP RIGHT AWAY IF:   Your pain does not get better with medicine.  Your arm, hand, leg, or foot loses feeling  (numbness) or changes color.  Your cut is bleeding.  Your joint feels weak, or you cannot use your joint.  You have painful lumps on your body.  Your cut is red, puffy (swollen), or painful.  You have a red line on the skin near the cut.  You have yellowish-white fluid (pus) coming from the cut.  You have a fever.  You have a bad smell coming from the cut or bandage.  Your cut breaks open before or after stitches are removed.  You notice something coming out of the cut, such as wood or glass.  You cannot move a finger or toe. MAKE SURE YOU:   Understand these instructions.  Will watch your condition.  Will get help right away if you are not doing well or get worse. Document Released: 12/17/2007 Document Revised: 09/22/2011 Document Reviewed: 12/24/2010 [facility] LPExitCare Patient Information 2014 [place]ExitCare, [place].  Stitches, Staples, or Skin Adhesive Strips  Stitches (sutures), staples, and skin adhesive strips hold the skin together as it heals. They will usually be in place for 7 days or less. HOME CARE  Wash your hands with soap and water before and after you touch  your wound.  Only take medicine as told by your doctor.  Cover your wound only if your doctor told you to. Otherwise, leave it open to air.  Do not get your stitches wet or dirty. If they get dirty, dab them gently with a clean washcloth. Wet the washcloth with soapy water. Do not rub. Pat them dry gently.  Do not put medicine or medicated cream on your stitches unless your doctor told you to.  Do not take out your own stitches or staples. Skin adhesive strips will fall off by themselves.  Do not pick at the wound. Picking can cause an infection.  Do not miss your follow-up appointment.  If you have problems or questions, call your doctor. GET HELP RIGHT AWAY IF:   You have a temperature by mouth above 102 F (38.9 C), not controlled by medicine.  You have chills.  You have redness or pain around your  stitches.  There is puffiness (swelling) around your stitches.  You notice fluid (drainage) from your stitches.  There is a bad smell coming from your wound. MAKE SURE YOU:  Understand these instructions.  Will watch your condition.  Will get help if you are not doing well or get worse. Document Released: 04/27/2009 Document Revised: 09/22/2011 Document Reviewed: 04/27/2009 [facility] CenterExitCare Patient Information 2014 [place], [place].

## 2013-10-25 NOTE — ED Notes (Addendum)
Lac to rt ring finger on sheet metal. Superficial abrasion to middle  Finger.  FROM and sensation intact.  Cleansed.No active bleeding.

## 2013-10-27 NOTE — ED Provider Notes (Signed)
CSN: [id number]     Arrival date & time 10/25/13  1015 History   First MD Initiated Contact with Patient 10/25/13 1118     Chief Complaint  Patient presents with  . Laceration     (Consider location/radiation/quality/duration/timing/severity/associated sxs/prior Treatment) Patient is a 33 y.o. male presenting with skin laceration. The history is provided by the patient.  Laceration Location:  Finger Finger laceration location:  R ring finger Length (cm):  3 Depth:  Cutaneous Quality: straight   Bleeding: controlled   Time since incident:  1 hour Laceration mechanism:  Metal edge Pain details:    Quality:  Aching and throbbing   Severity:  Moderate   Timing:  Constant   Progression:  Unchanged Foreign body present:  No foreign bodies Relieved by:  Nothing Worsened by:  Movement Ineffective treatments:  None tried Tetanus status:  Up to date   Past Medical History  Diagnosis Date  . Fractured coccyx     4 years ago   History reviewed. No pertinent past surgical history. Family History  Problem Relation Age of Onset  . Hypertension Mother   . Diabetes Mother   . Cancer Mother    History  Substance Use Topics  . Smoking status: Current Every Day Smoker -- 2.00 packs/day    Types: Cigarettes  . Smokeless tobacco: Never Used  . Alcohol Use: No    Review of Systems  Constitutional: Negative for fever and chills.  Musculoskeletal: Negative for arthralgias, back pain and joint swelling.  Skin: Positive for wound.       Laceration   Neurological: Negative for dizziness, weakness and numbness.  Hematological: Does not bruise/bleed easily.  All other systems reviewed and are negative.     Allergies  Hydrocodone  Home Medications   Prior to Admission medications   Medication Sig Start Date End Date Taking? Authorizing Provider  Aspirin-Acetaminophen-Caffeine (GOODY HEADACHE PO) Take 1 applicator by mouth every 4 (four) hours as needed. Pain    Historical  Provider, MD   BP 128/87  Pulse 82  Temp(Src) 98.3 F (36.8 C) (Oral)  Resp 18  SpO2 98% Physical Exam  Nursing note and vitals reviewed. Constitutional: He is oriented to person, place, and time. He appears well-developed and well-nourished. No distress.  HENT:  Head: Normocephalic and atraumatic.  Cardiovascular: Normal rate, regular rhythm, normal heart sounds and intact distal pulses.   No murmur heard. Pulmonary/Chest: Effort normal and breath sounds normal. No respiratory distress.  Musculoskeletal: He exhibits no edema and no tenderness.  Neurological: He is alert and oriented to person, place, and time. He exhibits normal muscle tone. Coordination normal.  Skin: Skin is warm. Laceration noted.  Superficial Laceration to the dorsal right fourth finger.  Bleeding controlled.  NV intact.  Pt has full ROM of the finger.  No edema.      ED Course  Procedures (including critical care time) Labs Review Labs Reviewed - No data to display  Imaging Review No results found.   EKG Interpretation None      LACERATION REPAIR Performed by: Calaya Gildner L. Fate Galanti Authorized by: Davita Sublett L. Rifky Lapre Consent: Verbal consent obtained. Risks and benefits: risks, benefits and alternatives were discussed Consent given by: patient Patient identity confirmed: provided demographic data Prepped and Draped in normal sterile fashion Wound explored  Laceration Location: dorsal right ring finger Laceration Length: 3 cm  No Foreign Bodies seen or palpated  Anesthesia: none    Irrigation method: syringe Amount of cleaning: standard  Skin  closure: steri-strips and dermabond    Technique: topical application  Patient tolerance: Patient tolerated the procedure well with no immediate complications.   MDM   Final diagnoses:  Laceration of finger    Superficial lac to the finger, NV intact.  Bleeding controlled.  Wound edges approximated well with steri strips and derma bond.     Patient agrees to return here for any signs of infection, Td is UTD    Danaysia Rader L. Trisha Mangleriplett, PA-C 10/27/13 2221

## 2013-10-31 NOTE — ED Provider Notes (Signed)
Medical screening examination/treatment/procedure(s) were performed by non-physician practitioner and as supervising physician I was immediately available for consultation/collaboration.   EKG Interpretation None       Krystel Fletchall, MD 10/31/13 0754 

## 2014-04-21 ENCOUNTER — Telehealth: Payer: Self-pay | Admitting: *Deleted

## 2014-04-21 NOTE — Telephone Encounter (Signed)
Pt requesting a letter for his Probation Office re:  MRSA diagnosis.  Pt last seen Sept. 13, 2015 by Dr. Drue SecondSnider.  Dr. Drue SecondSnider please advise.

## 2014-10-03 ENCOUNTER — Telehealth: Payer: Self-pay

## 2014-10-03 NOTE — Telephone Encounter (Signed)
Patient calling to request copy of Infectious Disease office notes . He was informed a medical release was needed.   Derek Josephsammy K Rachard Isidro, RN

## 2018-01-21 ENCOUNTER — Other Ambulatory Visit: Payer: Self-pay

## 2018-01-21 ENCOUNTER — Emergency Department (HOSPITAL_COMMUNITY): Payer: Self-pay

## 2018-01-21 ENCOUNTER — Emergency Department (HOSPITAL_COMMUNITY)
Admission: EM | Admit: 2018-01-21 | Discharge: 2018-01-21 | Disposition: A | Discharge status: Home / Self Care | Payer: Self-pay | Attending: Emergency Medicine | Admitting: Emergency Medicine

## 2018-01-21 ENCOUNTER — Encounter (HOSPITAL_COMMUNITY): Payer: Self-pay | Admitting: Emergency Medicine

## 2018-01-21 DIAGNOSIS — R0789 Other chest pain: Secondary | ICD-10-CM | POA: Insufficient documentation

## 2018-01-21 DIAGNOSIS — R030 Elevated blood-pressure reading, without diagnosis of hypertension: Secondary | ICD-10-CM | POA: Insufficient documentation

## 2018-01-21 DIAGNOSIS — Z716 Tobacco abuse counseling: Secondary | ICD-10-CM | POA: Insufficient documentation

## 2018-01-21 DIAGNOSIS — F17210 Nicotine dependence, cigarettes, uncomplicated: Secondary | ICD-10-CM | POA: Insufficient documentation

## 2018-01-21 DIAGNOSIS — R0602 Shortness of breath: Secondary | ICD-10-CM | POA: Insufficient documentation

## 2018-01-21 DIAGNOSIS — R079 Chest pain, unspecified: Secondary | ICD-10-CM

## 2018-01-21 LAB — CBC
HEMATOCRIT: 44.9 % (ref 39.0–52.0)
Hemoglobin: 15.2 g/dL (ref 13.0–17.0)
MCH: 30.2 pg (ref 26.0–34.0)
MCHC: 33.9 g/dL (ref 30.0–36.0)
MCV: 89.3 fL (ref 78.0–100.0)
Platelets: 346 10*3/uL (ref 150–400)
RBC: 5.03 MIL/uL (ref 4.22–5.81)
RDW: 13.4 % (ref 11.5–15.5)
WBC: 7.8 10*3/uL (ref 4.0–10.5)

## 2018-01-21 LAB — BASIC METABOLIC PANEL
Anion gap: 9 (ref 5–15)
BUN: 15 mg/dL (ref 6–20)
CO2: 27 mmol/L (ref 22–32)
Calcium: 9.4 mg/dL (ref 8.9–10.3)
Chloride: 103 mmol/L (ref 98–111)
Creatinine, Ser: 0.74 mg/dL (ref 0.61–1.24)
GFR calc Af Amer: >60 mL/min (ref >60)
GFR calc non Af Amer: >60 mL/min (ref >60)
Glucose, Bld: 79 mg/dL (ref 70–99)
POTASSIUM: 4.1 mmol/L (ref 3.5–5.1)
SODIUM: 139 mmol/L (ref 135–145)

## 2018-01-21 LAB — I-STAT TROPONIN, ED
Troponin i, poc: 0 ng/mL (ref 0.00–0.08)
Troponin i, poc: 0 ng/mL (ref 0.00–0.08)

## 2018-01-21 LAB — D-DIMER, QUANTITATIVE: D-Dimer, Quant: 0.32 ug/mL-FEU (ref 0.00–0.50)

## 2018-01-21 MED ORDER — MORPHINE SULFATE (PF) 4 MG/ML IV SOLN
4.0000 mg | Freq: Once | INTRAVENOUS | Status: AC
  Administered 2018-01-21: 4 mg via INTRAVENOUS
  Filled 2018-01-21: qty 1

## 2018-01-21 NOTE — ED Provider Notes (Signed)
[facility]-EMERGENCY DEPT Provider Note   CSN: [id number] Arrival date & time: 01/21/18  1718     History   Chief Complaint Chief Complaint  Patient presents with  . Chest Pain    HPI Derek Scott is a 37 y.o. male.  HPI  Patient is a 37 year old male with no significant past medical history presenting for left-sided chest pain.  Patient reports that it is been recurring and remitting today x3.  Patient describes the pain as sharp, radiating into his back, and waning at present.  Patient reports the pain is pleuritic, and he has been short of breath due to the pain, but denies shortness of breath prior to today.  Patient denies fevers, but patient reports that he sweats profusely on a daily basis.Patient denies any recent immobilization, hospitalization, hormone use, cancer treatment, history of DVT/PE, lower extremity pain or swelling.  Patient does report that he is his mother has a history of unprovoked PE.  Patient reports family history of MI in his paternal grandfather in his 57s and maternal grandfather in his 60s.  No history of early MI or cardiac disease in his parents.  Patient has an 80-pack-year history per pt.  Past Medical History:  Diagnosis Date  . Fractured coccyx (HCC)    4 years ago    Patient Active Problem List   Diagnosis Date Noted  . 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 No pertinent surgical history.      Home Medications    Prior to Admission medications   Medication Sig Start Date End Date Taking? Authorizing Provider  Multiple Vitamin (MULTIVITAMIN WITH MINERALS) TABS tablet Take 1 tablet by mouth daily.   Yes [provider]    Family History Family History  Problem Relation Age of Onset  . Hypertension Mother   . Diabetes Mother   . Cancer Mother     Social History Social History   Tobacco Use  . Smoking status: Current Every Day Smoker    Packs/day: 2.00    Types: Cigarettes  .  Smokeless tobacco: Never Used  Substance Use Topics  . Alcohol use: No  . Drug use: No     Allergies   Hydrocodone   Review of Systems Review of Systems  Constitutional: Positive for diaphoresis. Negative for chills and fever.  HENT: Negative for congestion, rhinorrhea, sinus pain and sore throat.   Eyes: Negative for visual disturbance.  Respiratory: Positive for chest tightness. Negative for cough and shortness of breath.   Cardiovascular: Positive for chest pain. Negative for palpitations and leg swelling.  Gastrointestinal: Negative for abdominal pain, nausea and vomiting.  Genitourinary: Negative for dysuria and flank pain.  Musculoskeletal: Negative for back pain and myalgias.  Skin: Negative for rash.  Neurological: Positive for light-headedness. Negative for dizziness, syncope and headaches.     Physical Exam Updated Vital Signs BP (!) 146/96 (BP Location: Right Arm)   Pulse 81   Temp 97.9 F (36.6 C) (Oral)   Resp 20   Ht 6\' 4"  (1.93 m)   Wt 86.2 kg (190 lb)   SpO2 98%   BMI 23.13 kg/m   Physical Exam  Constitutional: He appears well-developed and well-nourished. No distress.  HENT:  Head: Normocephalic and atraumatic.  Mouth/Throat: Oropharynx is clear and moist. No oropharyngeal exudate.  Eyes: Pupils are equal, round, and reactive to light. Conjunctivae and EOM are normal.  Neck: Normal range of motion. Neck supple.  Cardiovascular: Normal rate, regular rhythm,  S1 normal and S2 normal.  No murmur heard. Pulses:      Radial pulses are 2+ on the right side, and 2+ on the left side.  Pulmonary/Chest: Effort normal. He has decreased breath sounds. He has no wheezes. He has no rales.  Abdominal: Soft. He exhibits no distension. There is no tenderness. There is no guarding.  Musculoskeletal: Normal range of motion. He exhibits no edema or deformity.  Lymphadenopathy:    He has no cervical adenopathy.  Neurological: He is alert.  Cranial nerves grossly  intact. Patient moves extremities symmetrically and with good coordination.  Skin: Skin is warm and dry. No rash noted. No erythema.  Psychiatric: He has a normal mood and affect. His behavior is normal. Judgment and thought content normal.  Nursing note and vitals reviewed.    ED Treatments / Results  Labs (all labs ordered are listed, but only abnormal results are displayed) Labs Reviewed  CBC  BASIC METABOLIC PANEL  D-DIMER, QUANTITATIVE (NOT AT [facility])  I-STAT TROPONIN, ED    EKG EKG Interpretation  Date/Time:  Thursday January 21 2018 17:23:53 EDT Ventricular Rate:  82 PR Interval:    QRS Duration: 96 QT Interval:  353 QTC Calculation: 413 R Axis:   70 Text Interpretation:  Sinus rhythm Borderline short PR interval Probable left atrial enlargement No significant change since last tracing Confirmed by Shaune PollackIsaacs, Cameron [phone]([id number]) on 01/21/2018 6:16:49 PM   Radiology Dg Chest 2 View  Result Date: 01/21/2018 CLINICAL DATA:  LEFT side chest pain since this morning EXAM: CHEST - 2 VIEW COMPARISON:  None FINDINGS: Normal heart size, mediastinal contours, and pulmonary vascularity. Lungs clear. No pleural effusion or pneumothorax. Bones unremarkable. IMPRESSION: Normal exam. Electronically Signed   By: Ulyses SouthwardMark  Boles M.D.   On: 01/21/2018 17:44    Procedures Procedures (including critical care time)  Medications Ordered in ED Medications  morphine 4 MG/ML injection 4 mg (has no administration in time range)     Initial Impression / Assessment and Plan / ED Course  I have reviewed the triage vital signs and the nursing notes.  Pertinent labs & imaging results that were available during my care of the patient were reviewed by me and considered in my medical decision making (see chart for details).     Differential diagnosis includes ACS, PE, thoracic aortic dissection, Boerhaave's syndrome, cardiac tamponade, pneumothorax, incarcerated diaphragmatic hernia, cholecystitis,  esophageal spasm, gastroesophageal reflux, herpes zoster of the thorax, pericarditis, pneumonia,  chest wall pain, costochondritis.   Doubt ACS, as delta troponin is negative, initial and repeat EKGs show no signs of ischemia, infarction, or arrhythmia, and HEART score 1 (smoking history). EKG unchanged from prior. Doubt PE as Well's score 0 and PERC negative, and d-dimer negative. Doubt TAD by hx, CXR showed no widening mediastinum, and pulses equal in all extremities. Additionally, patient had a negative d-dimer. Patient remained nontoxic appearing and in no acute distress during emergency department course. Vital signs stable in the emergency department. Therefore, doubt esophageal rupture, cardiac tamponade, or pneumothorax. Pericarditis less likely due to no preceding infectious symptoms and pain not improved in upright positions. No abnormal labs.  Patient chest pain-free on final reevaluation.  Due to patient's extensive smoking history, and hypertensive readings in the emergency department, I placed a consult case management to assist patient in primary care. Appreciate their involvement.   Patient and family understand and are in agreement with plan of care.   Final Clinical Impressions(s) / ED Diagnoses   Final  diagnoses:  Left-sided chest pain  Encounter for smoking cessation counseling  Elevated blood pressure reading without diagnosis of hypertension    ED Discharge Orders    None       Delia Chimes 01/22/18 0152    Shaune Pollack, MD 01/22/18 1110

## 2018-01-21 NOTE — ED Triage Notes (Signed)
Pt states chest pain since this AM. States that it weakened throughout the day, but got sharper recently. Pt states that it radiates to back

## 2018-01-21 NOTE — Discharge Instructions (Signed)
Please read and follow all provided instructions.  Your diagnoses today include:  1. Left-sided chest pain   2. Encounter for smoking cessation counseling   3. Elevated blood pressure reading without diagnosis of hypertension     Tests performed today include: An EKG of your heart A chest x-ray Cardiac enzymes - a blood test for heart muscle damage Blood counts and electrolytes Vital signs. See below for your results today.   Your work-up is very reassuring today.  Heart enzymes x2 show no evidence of elevation.  This is reassuring that is unlikely you are having active heart attack today.  I also tested a smoke signal blood test to look for a blood clot in your lungs.  This was negative.  Medications prescribed:   Take any prescribed medications only as directed.  You may take Tylenol, 650 mg as needed for chest discomfort.  Follow-up instructions: Please follow-up with your primary care provider as soon as you can for further evaluation of your symptoms.   Return instructions:  SEEK IMMEDIATE MEDICAL ATTENTION IF: You have severe chest pain, especially if the pain is crushing or pressure-like and spreads to the arms, back, neck, or jaw, or if you have sweating, nausea (feeling sick to your stomach), or shortness of breath. THIS IS AN EMERGENCY. Don't wait to see if the pain will go away. Get medical help at once. Call 911 or 0 (operator). DO NOT drive yourself to the hospital.  Your chest pain gets worse and does not go away with rest.  You have an attack of chest pain lasting longer than usual, despite rest and treatment with the medications your caregiver has prescribed.  You wake from sleep with chest pain or shortness of breath. You feel dizzy or faint. You have chest pain not typical of your usual pain for which you originally saw your caregiver.  You have any other emergent concerns regarding your health.  Additional Information: Chest pain comes from many different  causes. Your caregiver has diagnosed you as having chest pain that is not specific for one problem, but does not require admission.  You are at low risk for an acute heart condition or other serious illness.   Your vital signs today were: BP 137/79    Pulse (!) 56    Temp 97.9 F (36.6 C) (Oral)    Resp 12    Ht 6\' 4"  (1.93 m)    Wt 86.2 kg (190 lb)    SpO2 98%    BMI 23.13 kg/m  If your blood pressure (BP) was elevated above 135/85 this visit, please have this repeated by your doctor within one month. --------------

## 2018-01-21 NOTE — ED Notes (Signed)
Pt ambulated to restroom without assistance.

## 2018-01-22 ENCOUNTER — Telehealth: Payer: Self-pay | Admitting: Emergency Medicine

## 2018-01-22 ENCOUNTER — Telehealth: Payer: Self-pay

## 2018-01-22 NOTE — Telephone Encounter (Signed)
Message receivhjoeed from Eldridge AbrahamsAngela Kritzer, RN CM requesting a hospital follow up appointment at [facility] or [facility].. There are currenlty no appointments available at this time. The phone numbers for both clinics has been placed on the ED AVS and the patient can call to check for cancellations.

## 2018-01-22 NOTE — Telephone Encounter (Signed)
Opened in error

## 2018-01-22 NOTE — Care Management Note (Signed)
Case Management Note  CM consulted for no pcp and no ins with need for follow up.  CM noted pt was given 3 clinics on AVS to schedule an appointment on his own.  CM will send a message to [facility] CM for possible cancellation appointment openings.  Elnoria HowardUpdated Murray, PA via messages.  No further CM needs noted at this time.  Fumie Fiallo, Lynnae SandhoffAngela N, RN 01/22/2018, 9:09 AM

## 2018-01-25 ENCOUNTER — Telehealth: Payer: Self-pay

## 2018-01-25 NOTE — Telephone Encounter (Addendum)
Call placed to the patient to schedule a [facility] appointment [facility]. There is an appointment available on 7.30/19. 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

## 2018-08-11 ENCOUNTER — Encounter (HOSPITAL_COMMUNITY): Payer: Self-pay

## 2018-08-11 ENCOUNTER — Emergency Department (HOSPITAL_COMMUNITY)
Admission: EM | Admit: 2018-08-11 | Discharge: 2018-08-11 | Disposition: A | Discharge status: Home / Self Care | Payer: Self-pay | Attending: Emergency Medicine | Admitting: Emergency Medicine

## 2018-08-11 ENCOUNTER — Emergency Department (HOSPITAL_COMMUNITY): Payer: Self-pay

## 2018-08-11 ENCOUNTER — Other Ambulatory Visit: Payer: Self-pay

## 2018-08-11 DIAGNOSIS — L0231 Cutaneous abscess of buttock: Secondary | ICD-10-CM | POA: Insufficient documentation

## 2018-08-11 DIAGNOSIS — F17210 Nicotine dependence, cigarettes, uncomplicated: Secondary | ICD-10-CM | POA: Insufficient documentation

## 2018-08-11 DIAGNOSIS — L03317 Cellulitis of buttock: Secondary | ICD-10-CM

## 2018-08-11 DIAGNOSIS — L0291 Cutaneous abscess, unspecified: Secondary | ICD-10-CM

## 2018-08-11 MED ORDER — SULFAMETHOXAZOLE-TRIMETHOPRIM 800-160 MG PO TABS: 1.0000 | ORAL_TABLET | Freq: Two times a day (BID) | ORAL | 0 refills | Status: AC

## 2018-08-11 MED ORDER — SULFAMETHOXAZOLE-TRIMETHOPRIM 800-160 MG PO TABS
1.0000 | ORAL_TABLET | Freq: Once | ORAL | Status: AC
  Administered 2018-08-11: 1 via ORAL
  Filled 2018-08-11: qty 1

## 2018-08-11 MED ORDER — LIDOCAINE-EPINEPHRINE (PF) 2 %-1:200000 IJ SOLN
10.0000 mL | Freq: Once | INTRAMUSCULAR | Status: AC
  Administered 2018-08-11: 10 mL
  Filled 2018-08-11: qty 20

## 2018-08-11 NOTE — ED Notes (Signed)
Discharge instructions reviewed with patient and police. Paperwork given to police. Patient escorted to jail.

## 2018-08-11 NOTE — ED Provider Notes (Signed)
[facility] EMERGENCY DEPARTMENT Provider Note   CSN: [id number] Arrival date & time: 08/11/18  1204     History   Chief Complaint Chief Complaint  Patient presents with  . Abscess    HPI Derek Scott is a 38 y.o. male.  Patient is a 38 y/o, currently incarcerated male with PMH IV drug use and confirmed recent IV drug use who presents to the ED for abscess to the left buttock. Patient reports he noticed it was there about 4 days ago. Reports he has been using IV drugs and "running from the cops" all night. Reports the wound began to drain today but is still very painful. Reports he felt feverish, chills, cold sweats, nausea and seeing things. Unclear if the latter symptoms are related to the abscess or active heroin use. Has hx of abscess in the past but unknown MRSA status. Reports UTD on tetanus within the last 2-3 years  The history is provided by the patient.  Abscess  Associated symptoms: fever and nausea   Associated symptoms: no fatigue and no headaches     Past Medical History:  Diagnosis Date  . Fractured coccyx (HCC)    4 years ago    Patient Active Problem List   Diagnosis Date Noted  . 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 No pertinent surgical history.      Home Medications    Prior to Admission medications   Medication Sig Start Date End Date Taking? Authorizing Provider  sulfamethoxazole-trimethoprim (BACTRIM DS,SEPTRA DS) 800-160 MG tablet Take 1 tablet by mouth 2 (two) times daily for 10 days. 08/11/18 08/21/18  Arlyn Dunning, PA-C    Family History Family History  Problem Relation Age of Onset  . Hypertension Mother   . Diabetes Mother   . Cancer Mother     Social History Social History   Tobacco Use  . Smoking status: Current Every Day Smoker    Packs/day: 2.00    Types: Cigarettes  . Smokeless tobacco: Never Used  Substance Use Topics  . Alcohol use: No  . Drug use: No     Allergies     Hydrocodone   Review of Systems Review of Systems  Constitutional: Positive for chills and fever. Negative for activity change, appetite change, fatigue and unexpected weight change.  Respiratory: Negative for cough and shortness of breath.   Cardiovascular: Negative for chest pain.  Gastrointestinal: Positive for nausea. Negative for abdominal pain and diarrhea.  Musculoskeletal: Negative for back pain, myalgias and neck pain.  Skin: Positive for color change and rash.  Allergic/Immunologic: Positive for immunocompromised state.       IV drug user  Neurological: Positive for light-headedness. Negative for dizziness and headaches.  Psychiatric/Behavioral: Positive for hallucinations. Negative for agitation.     Physical Exam Updated Vital Signs BP 134/88   Pulse 94   Temp 98.2 F (36.8 C) (Oral)   Resp 18   Ht 6\' 1"  (1.854 m)   Wt 90.7 kg   SpO2 96%   BMI 26.39 kg/m   Physical Exam Constitutional:      Appearance: He is normal weight.     Comments: Unkempt appearance  HENT:     Head: Normocephalic.     Nose: Nose normal.     Mouth/Throat:     Mouth: Mucous membranes are moist.  Eyes:     Conjunctiva/sclera: Conjunctivae normal.  Cardiovascular:     Rate and Rhythm: Normal rate and regular rhythm.  Pulmonary:     Effort: Pulmonary effort is normal.     Breath sounds: Normal breath sounds.  Skin:    General: Skin is warm.       Neurological:     General: No focal deficit present.     Mental Status: He is alert.  Psychiatric:        Mood and Affect: Mood normal.      ED Treatments / Results  Labs (all labs ordered are listed, but only abnormal results are displayed) Labs Reviewed - No data to display  EKG None  Radiology Dg Chest 1 View  Result Date: 08/11/2018 CLINICAL DATA:  Drug aspiration EXAM: CHEST  1 VIEW COMPARISON:  None. FINDINGS: Normal heart size. Lungs clear. No pneumothorax. No pleural effusion. IMPRESSION: No active cardiopulmonary  disease. Electronically Signed   By: Jolaine ClickArthur  Hoss M.D.   On: 08/11/2018 13:14    Procedures .Marland Kitchen.Incision and Drainage Date/Time: 08/11/2018 1:06 PM Performed by: Arlyn DunningMcLean, Kadyn Chovan A, PA-C Authorized by: Arlyn DunningMcLean, Rhona Fusilier A, PA-C   Consent:    Consent obtained:  Verbal   Consent given by:  Patient   Risks discussed:  Bleeding, incomplete drainage, pain and infection Location:    Type:  Abscess   Size:  3cm by 1.5 cm   Location: right buttock. Pre-procedure details:    Skin preparation:  Betadine Anesthesia (see MAR for exact dosages):    Anesthesia method:  Local infiltration   Local anesthetic:  Lidocaine 1% WITH epi Procedure type:    Complexity:  Simple Procedure details:    Incision types:  Single straight   Scalpel blade:  11   Drainage:  Purulent   Drainage amount:  Copious   Wound treatment:  Wound left open   Packing materials:  None Post-procedure details:    Patient tolerance of procedure:  Tolerated well, no immediate complications Comments:     Wound dressed with bacitracin and guaze.    (including critical care time)  Medications Ordered in ED Medications  lidocaine-EPINEPHrine (XYLOCAINE W/EPI) 2 %-1:200000 (PF) injection 10 mL (10 mLs Infiltration Given by Other 08/11/18 1234)  sulfamethoxazole-trimethoprim (BACTRIM DS,SEPTRA DS) 800-160 MG per tablet 1 tablet (1 tablet Oral Given 08/11/18 1234)     Initial Impression / Assessment and Plan / ED Course  I have reviewed the triage vital signs and the nursing notes.  Pertinent labs & imaging results that were available during my care of the patient were reviewed by me and considered in my medical decision making (see chart for details).  Clinical Course as of Aug 12 1327  Wed Aug 11, 2018  1228 At this time I was notified by nursing staff that patient swallowed a bag of 1/4 gram of heroin. Patient was seen placing bag in his mouth by staff and per staff report, police tried to get him to spit it back out. Patient had  a coughing episode and swallowed the heroin. Patient asymptomatic at this time. I will get 1 view chest xray to be sure patient did not aspirate.  Patient states he usually does 3 grams of heroin a day so I am not concerned for overdose given the amount taken now and due to it being taken PO route. Nursing staff to monitor for symptoms of OD and notify providers if any observed.    [KM]  1310 Patient's abscess was drained without difficulty (see procedure note). Patient tolerated well. Patient was sleeping but easily awakened and not showing any signs of overdose. He also  is afebrile and does not appear septic. Patient was given instructions on wound care and advised to have wound check in 2 days. He will be started on antibiotics with MRSA coverage given the diagnosis of purulent cellulitis.    [KM]    Clinical Course User Index [KM] Arlyn DunningMcLean, Keegan Ducey A, PA-C   Patient Counseled pt on good return precautions and encouraged both PCP and ED follow-up as needed.  Prior to discharge, I also discussed incidental imaging findings with patient in detail and advised appropriate, recommended follow-up in detail.  Clinical Impression: 1. Abscess   2. Cellulitis of buttock     Disposition: Discharge     Final Clinical Impressions(s) / ED Diagnoses   Final diagnoses:  Abscess  Cellulitis of buttock    ED Discharge Orders         Ordered    sulfamethoxazole-trimethoprim (BACTRIM DS,SEPTRA DS) 800-160 MG tablet  2 times daily     08/11/18 [address]           Adelaido Nicklaus A, PA-C 08/11/18 1329    Shaune PollackIsaacs, Cameron, MD 08/12/18 [phone]

## 2018-08-11 NOTE — ED Triage Notes (Signed)
Pt arrives in custody due to a "lump" on lower back that ruptured today. Pt states it was on his lower back. Pt endorses pain. Pt states hx of abscesses. Pt endorses fever/chills at home.

## 2018-08-11 NOTE — Discharge Instructions (Signed)
You have been seen in the Emergency Department (ED) today for an abscess.  This was drained in the ED.  Please follow up with your doctor or in the ED in 24-48 hours for recheck of your wound.  Read through the additional discharge instructions included below regarding wound care recommendations.  Call your doctor sooner or return to the ED if you develop worsening signs of infection such as: increased redness, increased pain, pus, or fever.   Thank you for allowing me to care for you today. Please return to the emergency department if you have new or worsening symptoms. Take your medications as instructed.

## 2018-08-11 NOTE — ED Notes (Signed)
RN went into patients room due to officers with patient yelling at patient to "spit it out." When entering room officers had patient face down attempting to get him to empty his mouth. Multiple staff members entered room to help assist. When observing airway patient had already swallowed the object. PA notified and patient admitted to swallowing a quarter gram of heroin. MD aware and patient placed on cardiac monitor and chest x-ray ordered. Airway was checked and clear.

## 2018-08-11 NOTE — ED Notes (Signed)
ED Provider at bedside. 

## 2022-09-29 ENCOUNTER — Other Ambulatory Visit: Payer: Self-pay

## 2022-09-29 ENCOUNTER — Emergency Department (HOSPITAL_COMMUNITY)
Admission: EM | Admit: 2022-09-29 | Discharge: 2022-09-29 | Disposition: A | Discharge status: Home / Self Care | Payer: Self-pay | Attending: Emergency Medicine | Admitting: Emergency Medicine

## 2022-09-29 ENCOUNTER — Encounter (HOSPITAL_COMMUNITY): Payer: Self-pay

## 2022-09-29 DIAGNOSIS — L0501 Pilonidal cyst with abscess: Secondary | ICD-10-CM | POA: Insufficient documentation

## 2022-09-29 MED ORDER — SULFAMETHOXAZOLE-TRIMETHOPRIM 800-160 MG PO TABS: 1.0000 | ORAL_TABLET | Freq: Two times a day (BID) | ORAL | 0 refills | Status: AC

## 2022-09-29 NOTE — ED Provider Notes (Signed)
[facility] Provider Note   CSN: [id number] Arrival date & time: 09/29/22  [id number]     History  Chief Complaint  Patient presents with   Cyst    Derek Scott is a 42 y.o. male.  42 year old male presents today for evaluation of flareup of his pilonidal cyst.  He states this has been an ongoing issue for the past 8 months.  Reports some increased pain but otherwise no change today.  He has not seen a Education officer, environmental for this at all.  Previously when it flared up he had this drained and was placed on Bactrim.  Currently denies any fever.  He is tachycardic but states that this is not unusual for him when he comes to the hospital as this gives him a lot of anxiety.  The history is provided by the patient. No language interpreter was used.       Home Medications Prior to Admission medications   Not on File      Allergies    Bee venom, Hydrocodone-acetaminophen, and Hydrocodone    Review of Systems   Review of Systems  Constitutional:  Negative for chills and fever.  Skin:  Positive for wound.  All other systems reviewed and are negative.   Physical Exam Updated Vital Signs BP (!) 178/92 (BP Location: Left Arm)   Pulse (!) 112   Temp 98.2 F (36.8 C) (Oral)   Resp 18   Wt 90 kg   SpO2 100%   BMI 26.18 kg/m  Physical Exam Vitals and nursing note reviewed. Exam conducted with a chaperone present (roberto, NT).  Constitutional:      General: He is not in acute distress.    Appearance: Normal appearance. He is not ill-appearing.  HENT:     Head: Normocephalic and atraumatic.     Nose: Nose normal.  Eyes:     Conjunctiva/sclera: Conjunctivae normal.  Cardiovascular:     Rate and Rhythm: Regular rhythm. Tachycardia present.  Pulmonary:     Effort: Pulmonary effort is normal. No respiratory distress.  Musculoskeletal:        General: No deformity.  Skin:    Findings: No rash.       Neurological:     Mental  Status: He is alert.     ED Results / Procedures / Treatments   Labs (all labs ordered are listed, but only abnormal results are displayed) Labs Reviewed - No data to display  EKG None  Radiology No results found.  Procedures Procedures    Medications Ordered in ED Medications - No data to display  ED Course/ Medical Decision Making/ A&P                             Medical Decision Making  42 year old male with past medical history significant for drug use, pilonidal cyst presents today for pilonidal cyst.  He states symptoms have been ongoing for the past 8 months.  No significant change today.  Denies fever.  He is tachycardic but states this is usual when he comes into the hospital.  We discussed options including antibiotics, close follow-up with general surgeon, for attempt at drainage, antibiotics and follow-up with surgeon.  Following this discussion he prefers to trial the antibiotics and follow-up with general surgery and he will return for any worsening symptoms.  Feel this is reasonable.  Patient is appropriate for discharge.  Discharged in stable condition.  Will place patient on Bactrim.   Final Clinical Impression(s) / ED Diagnoses Final diagnoses:  Pilonidal cyst with abscess    Rx / DC Orders ED Discharge Orders          Ordered    sulfamethoxazole-trimethoprim (BACTRIM DS) 800-160 MG tablet  2 times daily        09/29/22 1020              Evlyn Courier, PA-C 09/29/22 1022    Sherwood Gambler, MD 10/03/22 [phone]

## 2022-09-29 NOTE — Discharge Instructions (Signed)
Your exam today was overall reassuring.  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  We discussed options including attempting to drain and then placing on antibiotics and following up with surgery versus starting on antibiotics and following up with surgery and returning to the emergency department for any worsening symptoms.  You are in agreement discharge antibiotics and close to follow-up with surgery and return for any worsening symptoms.  If you have any concerning symptoms such as fever, severe pain, worsening redness despite being on antibiotics please return to the emergency room.  Otherwise call the surgeon to schedule your follow-up appointment.
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
## 2022-10-10 ENCOUNTER — Emergency Department (HOSPITAL_COMMUNITY)
Admission: EM | Admit: 2022-10-10 | Discharge: 2022-10-10 | Discharge status: Against Medical Advice | Payer: Self-pay | Attending: Emergency Medicine | Admitting: Emergency Medicine

## 2022-10-10 ENCOUNTER — Encounter (HOSPITAL_COMMUNITY): Payer: Self-pay

## 2022-10-10 DIAGNOSIS — Z5321 Procedure and treatment not carried out due to patient leaving prior to being seen by health care provider: Secondary | ICD-10-CM | POA: Insufficient documentation

## 2022-10-10 DIAGNOSIS — H5789 Other specified disorders of eye and adnexa: Secondary | ICD-10-CM | POA: Insufficient documentation
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
## 2023-06-06 ENCOUNTER — Emergency Department (HOSPITAL_COMMUNITY): Payer: Self-pay

## 2023-06-06 ENCOUNTER — Encounter (HOSPITAL_COMMUNITY): Payer: Self-pay

## 2023-06-06 ENCOUNTER — Emergency Department (HOSPITAL_COMMUNITY)
Admission: EM | Admit: 2023-06-06 | Discharge: 2023-06-06 | Disposition: A | Discharge status: Home / Self Care | Payer: Self-pay | Attending: Emergency Medicine | Admitting: Emergency Medicine

## 2023-06-06 ENCOUNTER — Other Ambulatory Visit: Payer: Self-pay

## 2023-06-06 DIAGNOSIS — H209 Unspecified iridocyclitis: Secondary | ICD-10-CM | POA: Insufficient documentation

## 2023-06-06 DIAGNOSIS — Z23 Encounter for immunization: Secondary | ICD-10-CM | POA: Insufficient documentation

## 2023-06-06 DIAGNOSIS — S060X0A Concussion without loss of consciousness, initial encounter: Secondary | ICD-10-CM | POA: Diagnosis not present

## 2023-06-06 MED ORDER — TETANUS-DIPHTH-ACELL PERTUSSIS 5-2.5-18.5 LF-MCG/0.5 IM SUSY
0.5000 mL | PREFILLED_SYRINGE | Freq: Once | INTRAMUSCULAR | Status: AC
  Administered 2023-06-06: 0.5 mL via INTRAMUSCULAR
  Filled 2023-06-06: qty 0.5

## 2023-06-06 MED ORDER — PREDNISOLONE ACETATE 1 % OP SUSP: 1.0000 [drp] | Freq: Four times a day (QID) | OPHTHALMIC | 0 refills | Status: AC

## 2023-06-06 MED ORDER — CYCLOPENTOLATE HCL 1 % OP SOLN: 1.0000 [drp] | Freq: Two times a day (BID) | OPHTHALMIC | 0 refills | Status: AC | PRN
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
## 2023-06-06 NOTE — Discharge Instructions (Signed)
Follow up with Dr. Sherrine Maples, call the office on Monday.  If you develop new or worsening headache, dizziness, vomiting, new or worsening eye pain or vision changes, or any other new/concerning symptoms then return to the ER or call 911.

## 2023-06-06 NOTE — ED Notes (Signed)
Patient transported to CT 
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
## 2023-06-28 ENCOUNTER — Emergency Department (HOSPITAL_COMMUNITY): Payer: BC Managed Care – PPO

## 2023-06-28 ENCOUNTER — Encounter (HOSPITAL_COMMUNITY): Payer: Self-pay

## 2023-06-28 ENCOUNTER — Other Ambulatory Visit: Payer: Self-pay

## 2023-06-28 ENCOUNTER — Inpatient Hospital Stay (HOSPITAL_COMMUNITY)
Admission: EM | Admit: 2023-06-28 | Discharge: 2023-06-29 | DRG: 314 | Discharge status: Against Medical Advice | Payer: BC Managed Care – PPO | Attending: Internal Medicine | Admitting: Internal Medicine

## 2023-06-28 DIAGNOSIS — F1210 Cannabis abuse, uncomplicated: Secondary | ICD-10-CM | POA: Diagnosis present

## 2023-06-28 DIAGNOSIS — F17210 Nicotine dependence, cigarettes, uncomplicated: Secondary | ICD-10-CM | POA: Diagnosis present

## 2023-06-28 DIAGNOSIS — Z809 Family history of malignant neoplasm, unspecified: Secondary | ICD-10-CM

## 2023-06-28 DIAGNOSIS — Z833 Family history of diabetes mellitus: Secondary | ICD-10-CM

## 2023-06-28 DIAGNOSIS — Z5329 Procedure and treatment not carried out because of patient's decision for other reasons: Secondary | ICD-10-CM | POA: Diagnosis present

## 2023-06-28 DIAGNOSIS — I3139 Other pericardial effusion (noninflammatory): Principal | ICD-10-CM | POA: Diagnosis present

## 2023-06-28 DIAGNOSIS — Z885 Allergy status to narcotic agent status: Secondary | ICD-10-CM | POA: Diagnosis not present

## 2023-06-28 DIAGNOSIS — F1410 Cocaine abuse, uncomplicated: Secondary | ICD-10-CM | POA: Diagnosis present

## 2023-06-28 DIAGNOSIS — Z91030 Bee allergy status: Secondary | ICD-10-CM

## 2023-06-28 DIAGNOSIS — R079 Chest pain, unspecified: Secondary | ICD-10-CM | POA: Diagnosis present

## 2023-06-28 DIAGNOSIS — I301 Infective pericarditis: Secondary | ICD-10-CM

## 2023-06-28 DIAGNOSIS — R0789 Other chest pain: Principal | ICD-10-CM

## 2023-06-28 DIAGNOSIS — J1282 Pneumonia due to coronavirus disease 2019: Secondary | ICD-10-CM | POA: Diagnosis present

## 2023-06-28 DIAGNOSIS — Z66 Do not resuscitate: Secondary | ICD-10-CM | POA: Diagnosis present

## 2023-06-28 DIAGNOSIS — Z8249 Family history of ischemic heart disease and other diseases of the circulatory system: Secondary | ICD-10-CM | POA: Diagnosis not present

## 2023-06-28 DIAGNOSIS — U071 COVID-19: Principal | ICD-10-CM | POA: Diagnosis present

## 2023-06-28 DIAGNOSIS — E876 Hypokalemia: Secondary | ICD-10-CM | POA: Diagnosis present

## 2023-06-28 DIAGNOSIS — J189 Pneumonia, unspecified organism: Secondary | ICD-10-CM

## 2023-06-28 LAB — CBC WITH DIFFERENTIAL/PLATELET
Abs Immature Granulocytes: 0.03 10*3/uL (ref 0.00–0.07)
Basophils Absolute: 0 10*3/uL (ref 0.0–0.1)
Basophils Relative: 0 %
Eosinophils Absolute: 0.1 10*3/uL (ref 0.0–0.5)
Eosinophils Relative: 1 %
HCT: 35 % — ABNORMAL LOW (ref 39.0–52.0)
Hemoglobin: 12.1 g/dL — ABNORMAL LOW (ref 13.0–17.0)
Immature Granulocytes: 0 %
Lymphocytes Relative: 17 %
Lymphs Abs: 1.7 10*3/uL (ref 0.7–4.0)
MCH: 30.1 pg (ref 26.0–34.0)
MCHC: 34.6 g/dL (ref 30.0–36.0)
MCV: 87.1 fL (ref 80.0–100.0)
Monocytes Absolute: 0.8 10*3/uL (ref 0.1–1.0)
Monocytes Relative: 8 %
Neutro Abs: 7.7 10*3/uL (ref 1.7–7.7)
Neutrophils Relative %: 74 %
Platelets: 345 10*3/uL (ref 150–400)
RBC: 4.02 MIL/uL — ABNORMAL LOW (ref 4.22–5.81)
RDW: 13.5 % (ref 11.5–15.5)
WBC: 10.4 10*3/uL (ref 4.0–10.5)
nRBC: 0 % (ref 0.0–0.2)

## 2023-06-28 LAB — RAPID URINE DRUG SCREEN, HOSP PERFORMED
Amphetamines: NOT DETECTED
Barbiturates: NOT DETECTED
Benzodiazepines: NOT DETECTED
Cocaine: POSITIVE — AB
Opiates: NOT DETECTED
Tetrahydrocannabinol: POSITIVE — AB

## 2023-06-28 LAB — COMPREHENSIVE METABOLIC PANEL
ALT: 19 U/L (ref 0–44)
AST: 18 U/L (ref 15–41)
Albumin: 3.2 g/dL — ABNORMAL LOW (ref 3.5–5.0)
Alkaline Phosphatase: 71 U/L (ref 38–126)
Anion gap: 8 (ref 5–15)
BUN: 9 mg/dL (ref 6–20)
CO2: 30 mmol/L (ref 22–32)
Calcium: 8.2 mg/dL — ABNORMAL LOW (ref 8.9–10.3)
Chloride: 93 mmol/L — ABNORMAL LOW (ref 98–111)
Creatinine, Ser: 0.72 mg/dL (ref 0.61–1.24)
GFR, Estimated: >60 mL/min (ref >60)
Glucose, Bld: 101 mg/dL — ABNORMAL HIGH (ref 70–99)
Potassium: 2.8 mmol/L — ABNORMAL LOW (ref 3.5–5.1)
Sodium: 131 mmol/L — ABNORMAL LOW (ref 135–145)
Total Bilirubin: 0.9 mg/dL (ref <1.2)
Total Protein: 7.1 g/dL (ref 6.5–8.1)

## 2023-06-28 LAB — TROPONIN I (HIGH SENSITIVITY)
Troponin I (High Sensitivity): 5 ng/L (ref <18)
Troponin I (High Sensitivity): 5 ng/L (ref <18)

## 2023-06-28 LAB — MAGNESIUM: Magnesium: 2.2 mg/dL (ref 1.7–2.4)

## 2023-06-28 LAB — RESP PANEL BY RT-PCR (RSV, FLU A&B, COVID)  RVPGX2
Influenza A by PCR: NEGATIVE
Influenza B by PCR: NEGATIVE
Resp Syncytial Virus by PCR: NEGATIVE
SARS Coronavirus 2 by RT PCR: POSITIVE — AB

## 2023-06-28 LAB — I-STAT CG4 LACTIC ACID, ED: Lactic Acid, Venous: 1.7 mmol/L (ref 0.5–1.9)

## 2023-06-28 LAB — ETHANOL: Alcohol, Ethyl (B): <10 mg/dL (ref <10)

## 2023-06-28 MED ORDER — POTASSIUM CHLORIDE 10 MEQ/100ML IV SOLN
10.0000 meq | INTRAVENOUS | Status: AC
  Administered 2023-06-28 – 2023-06-29 (×4): 10 meq via INTRAVENOUS
  Filled 2023-06-28 (×4): qty 100

## 2023-06-28 MED ORDER — KETOROLAC TROMETHAMINE 30 MG/ML IJ SOLN: 30.0000 mg | Freq: Once | INTRAMUSCULAR | Status: DC

## 2023-06-28 MED ORDER — IOHEXOL 350 MG/ML SOLN
100.0000 mL | Freq: Once | INTRAVENOUS | Status: AC | PRN
  Administered 2023-06-28: 100 mL via INTRAVENOUS

## 2023-06-28 MED ORDER — SODIUM CHLORIDE 0.9 % IV SOLN
1.0000 g | Freq: Once | INTRAVENOUS | Status: AC
  Administered 2023-06-28: 1 g via INTRAVENOUS
  Filled 2023-06-28: qty 10

## 2023-06-28 MED ORDER — SODIUM CHLORIDE 0.9 % IV SOLN: INTRAVENOUS | Status: DC

## 2023-06-28 MED ORDER — SODIUM CHLORIDE 0.9 % IV SOLN
500.0000 mg | Freq: Once | INTRAVENOUS | Status: AC
  Administered 2023-06-28: 500 mg via INTRAVENOUS
  Filled 2023-06-28: qty 5

## 2023-06-28 MED ORDER — SODIUM CHLORIDE 0.9 % IV SOLN: INTRAVENOUS | Status: AC

## 2023-06-28 MED ORDER — POTASSIUM CHLORIDE 10 MEQ/100ML IV SOLN
10.0000 meq | Freq: Once | INTRAVENOUS | Status: AC
  Administered 2023-06-28: 10 meq via INTRAVENOUS
  Filled 2023-06-28: qty 100

## 2023-06-28 MED ORDER — POTASSIUM CHLORIDE 20 MEQ PO PACK
40.0000 meq | PACK | Freq: Once | ORAL | Status: AC
  Administered 2023-06-28: 40 meq via ORAL
  Filled 2023-06-28: qty 2
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
## 2023-06-28 NOTE — ED Provider Notes (Signed)
[place] EMERGENCY DEPARTMENT AT [facility] Provider Note   CSN: [id number] Arrival date & time: 06/28/23  1719     History  Chief Complaint  Patient presents with   Chest Pain    Derek Scott is a 42 y.o. male.  42 year old male with prior medical history as detailed below presents for evaluation.  Patient reports approximately 5 to 6 days of persistent left-sided chest discomfort.  Patient's pain is reproduced with movement or with deep inhalation.  He denies fever.  He also complains of mild cough, congestion, mild malaise and fatigue.  He reports intermittent cocaine use.  His last cocaine use was approximately 5 to 6 days ago.  He does not feel that the cocaine use is at all associated with his reported chest discomfort.  He also smokes daily.  He reports that the pain was worse today and he had so much pain that he was incontinent of urine.  He was concerned about this and decided to come to the ED for evaluation.  He tells me that he cannot remember all the details of this morning.  He remembers waking up but he feels like he may have "lost time" over the course of the morning.  He denies syncope.  He denies LOC.  The history is provided by the patient and medical records.       Home Medications Prior to Admission medications   Medication Sig Start Date End Date Taking? Authorizing Provider  cyclopentolate (CYCLODRYL,CYCLOGYL) 1 % ophthalmic solution Place 1 drop into the left eye 2 (two) times daily as needed (eye pain). 06/06/23   Pricilla Loveless, MD  prednisoLONE acetate (PREDNISOLONE ACETATE P-F) 1 % ophthalmic suspension Place 1 drop into the left eye 4 (four) times daily. Do not use for more than 10 days. 06/06/23   Pricilla Loveless, MD      Allergies    Bee venom, Hydrocodone, and Hydrocodone-acetaminophen    Review of Systems   Review of Systems  All other systems reviewed and are negative.   Physical Exam Updated Vital Signs BP  121/72   Pulse (!) 109   Temp 98 F (36.7 C) (Oral)   Resp 12   Ht 6\' 1"  (1.854 m)   Wt 90 kg   SpO2 96%   BMI 26.18 kg/m  Physical Exam Vitals and nursing note reviewed.  Constitutional:      General: He is not in acute distress.    Appearance: Normal appearance. He is well-developed.  HENT:     Head: Normocephalic and atraumatic.  Eyes:     Conjunctiva/sclera: Conjunctivae normal.     Pupils: Pupils are equal, round, and reactive to light.  Cardiovascular:     Rate and Rhythm: Normal rate and regular rhythm.     Heart sounds: Normal heart sounds.  Pulmonary:     Effort: Pulmonary effort is normal. No respiratory distress.     Breath sounds: Normal breath sounds.  Abdominal:     General: There is no distension.     Palpations: Abdomen is soft.     Tenderness: There is no abdominal tenderness.  Musculoskeletal:        General: No deformity. Normal range of motion.     Cervical back: Normal range of motion and neck supple.  Skin:    General: Skin is warm and dry.  Neurological:     General: No focal deficit present.     Mental Status: He is alert and oriented to person,  place, and time.     ED Results / Procedures / Treatments   Labs (all labs ordered are listed, but only abnormal results are displayed) Labs Reviewed  CBC WITH DIFFERENTIAL/PLATELET - Abnormal; Notable for the following components:      Result Value   RBC 4.02 (*)    Hemoglobin 12.1 (*)    HCT 35.0 (*)    All other components within normal limits  RESP PANEL BY RT-PCR (RSV, FLU A&B, COVID)  [id number]  COMPREHENSIVE METABOLIC PANEL  ETHANOL  RAPID URINE DRUG SCREEN, HOSP PERFORMED  TROPONIN I (HIGH SENSITIVITY)    EKG EKG Interpretation Date/Time:  Sunday June 28 2023 17:33:05 EST Ventricular Rate:  115 PR Interval:  118 QRS Duration:  93 QT Interval:  331 QTC Calculation: 458 R Axis:   84  Text Interpretation: Sinus tachycardia Paired ventricular premature complexes Probable left  atrial enlargement Borderline T wave abnormalities Minimal ST elevation, inferior leads Baseline wander in lead(s) V3 Confirmed by Kristine Royal [id number]) on 06/28/2023 5:50:56 PM  Radiology No results found.  Procedures Procedures    Medications Ordered in ED Medications - No data to display  ED Course/ Medical Decision Making/ A&P                                 Medical Decision Making Amount and/or Complexity of Data Reviewed Labs: ordered. Radiology: ordered.  Risk Prescription drug management.    Medical Screen Complete  This patient presented to the ED with complaint of left-sided pleuritic chest pain, cough.  This complaint involves an extensive number of treatment options. The initial differential diagnosis includes, but is not limited to, pneumonia, pneumothorax, pericardial effusion, pleural effusion, metabolic abnormality, etc.  This presentation is: Acute, Chronic, Self-Limited, Previously Undiagnosed, Uncertain Prognosis, Complicated, Systemic Symptoms, and Threat to Life/Bodily Function  Patient with approximately 1 week of left-sided pleuritic chest discomfort.  He also reports URI symptoms including cough.  Patient without significant prior medical history.  He does report regular tobacco use.  He last used cocaine approximately 1 week ago.  Workup demonstrates moderate to large size pericardial effusion on CT.  No aortic catastrophe identified on CT imaging.  No evidence of tamponade physiology.  Patient also with left lower lobe infiltrate on CT.  Patient's COVID test is positive.  Will start antibiotics for treatment of pneumonia.  Dr. Regino Schultze with his [facility] cardiology is aware of case.  Patient will be consulted on by cardiology in the morning.  Hospitalist service made aware of case and will evaluate for admission.  Additional history obtained:  External records from outside sources obtained and reviewed including prior ED visits and prior Inpatient  records.    Lab Tests:  I ordered and personally interpreted labs.  The pertinent results include: CBC, CMP, COVID, flu, troponin   Imaging Studies ordered:  I ordered imaging studies including chest x-ray, CTA chest I independently visualized and interpreted obtained imaging which showed pericardial effusion, left lower lobe infiltrate I agree with the radiologist interpretation.   Cardiac Monitoring:  The patient was maintained on a cardiac monitor.  I personally viewed and interpreted the cardiac monitor which showed an underlying rhythm of: Sinus tach then NSR   Medicines ordered:  I ordered medication including antibiotics, potassium for infection, hypokalemia Reevaluation of the patient after these medicines showed that the patient: improved   Problem List / ED Course:  Atypical chest pain, pneumonia, pericardial  effusion   Reevaluation:  After the interventions noted above, I reevaluated the patient and found that they have: improved   Disposition:  After consideration of the diagnostic results and the patients response to treatment, I feel that the patent would benefit from admission.   CRITICAL CARE Performed by: Wynetta Fines   Total critical care time: 30 minutes  Critical care time was exclusive of separately billable procedures and treating other patients.  Critical care was necessary to treat or prevent imminent or life-threatening deterioration.  Critical care was time spent personally by me on the following activities: development of treatment plan with patient and/or surrogate as well as nursing, discussions with consultants, evaluation of patient's response to treatment, examination of patient, obtaining history from patient or surrogate, ordering and performing treatments and interventions, ordering and review of laboratory studies, ordering and review of radiographic studies, pulse oximetry and re-evaluation of patient's  condition.          Final Clinical Impression(s) / ED Diagnoses Final diagnoses:  Atypical chest pain  Pneumonia of left lower lobe due to infectious organism  Pericardial effusion    Rx / DC Orders ED Discharge Orders     None         Wynetta Fines, MD 06/28/23 2011

## 2023-06-28 NOTE — H&P (Addendum)
History and Physical    Patient: Derek Scott WNU:[id number] DOB: 1981/04/26 DOA: 06/28/2023 DOS: the patient was seen and examined on 06/28/2023 PCP: Patient, No Pcp Per  Patient coming from: unknown  Chief Complaint:  Chief Complaint  Patient presents with   Chest Pain   HPI: Derek Scott is a 42 y.o. male with medical history significant for drug abuse, currently cocaine and marijuana.  He has used heroin in the past.  The patient presented with severe chest pain.  He reports that the pain started 4 days ago he rates it a 10 out of 10.  The patient reports that he has been in prison and has endured lots of pain in his life and this chest pain was so severe that he urinated on himself earlier.  He feels like he cannot take a deep breath.  The pain is in his left upper chest he says above his heart and it radiates into his shoulder and sometimes down into his left elbow.  Seems to be worse with movement.  He has had several movements over the last 2 days where he cannot remember what happened.  He denies any fevers or cough or sore throat or shortness of breath.  He denies any diarrhea.   His workup in the emergency department included a CTA of the chest abdomen pelvis as part of a dissection rule out protocol.  The CTA found the moderate to large size pericardial effusion and left lower lobe infiltrate. He was also found to be Covid positive.  The patient tells me he was diagnosed with a pericardial effusion 4 years ago.  He says he was in prison at that time and he was brought to this hospital and given that diagnosis.  He was symptom-free however until 4 days ago.  When I review his records I do not see any old echocardiogram or CT of the chest.  I do not see any overnight admissions or any diagnosis of pericardial effusion from the [facility] system.  As a matter fact his last chest x-ray from 12 months ago reported normal heart size.    Review of Systems: As mentioned in the history of  present illness. All other systems reviewed and are negative. Past Medical History:  Diagnosis Date   Fractured coccyx (HCC)    4 years ago   History reviewed. No pertinent surgical history. Social History:  reports that he has been smoking cigarettes. He has never used smokeless tobacco. He reports current drug use. Drug: Marijuana. He reports that he does not drink alcohol.  Allergies  Allergen Reactions   Bee Venom Anaphylaxis   Hydrocodone Rash   Hydrocodone-Acetaminophen Rash    Family History  Problem Relation Age of Onset   Hypertension Mother    Diabetes Mother    Cancer Mother     Prior to Admission medications   Medication Sig Start Date End Date Taking? Authorizing Provider  cyclopentolate (CYCLODRYL,CYCLOGYL) 1 % ophthalmic solution Place 1 drop into the left eye 2 (two) times daily as needed (eye pain). 06/06/23   Pricilla Loveless, MD  prednisoLONE acetate (PREDNISOLONE ACETATE P-F) 1 % ophthalmic suspension Place 1 drop into the left eye 4 (four) times daily. Do not use for more than 10 days. 06/06/23   Pricilla Loveless, MD    Physical Exam: Vitals:   06/28/23 1900 06/28/23 1915 06/28/23 1930 06/28/23 2001  BP: 117/75 117/81 107/73 116/81  Pulse: 87 90 88 86  Resp: 17 (!) 21 20 (!)  22  Temp:    98.4 F (36.9 C)  TempSrc:      SpO2: 95% 99% 95% 98%  Weight:      Height:       Physical Exam:  General: No acute distress, well developed, well nourished HEENT: Normocephalic, atraumatic, PERRL Cardiovascular: Normal rate and rhythm. Distal pulses intact. Pulmonary: left lower lobe adventitial sounds, difficulty taking a deep breath Gastrointestinal: Nondistended abdomen, soft, non-tender, normoactive bowel sounds Musculoskeletal:Normal ROM, no lower ext edema Skin: Skin is warm and dry. Neuro: No focal deficits noted, AAOx3. PSYCH: Attentive and cooperative  Data Reviewed:  Results for orders placed or performed during the hospital encounter of 06/28/23  (from the past 24 hours)  CBC with Differential     Status: Abnormal   Collection Time: 06/28/23  5:56 PM  Result Value Ref Range   WBC 10.4 4.0 - 10.5 K/uL   RBC 4.02 (L) 4.22 - 5.81 MIL/uL   Hemoglobin 12.1 (L) 13.0 - 17.0 g/dL   HCT 40.9 (L) 81.1 - 91.4 %   MCV 87.1 80.0 - 100.0 fL   MCH 30.1 26.0 - 34.0 pg   MCHC 34.6 30.0 - 36.0 g/dL   RDW 78.2 95.6 - 21.3 %   Platelets 345 150 - 400 K/uL   nRBC 0.0 0.0 - 0.2 %   Neutrophils Relative % 74 %   Neutro Abs 7.7 1.7 - 7.7 K/uL   Lymphocytes Relative 17 %   Lymphs Abs 1.7 0.7 - 4.0 K/uL   Monocytes Relative 8 %   Monocytes Absolute 0.8 0.1 - 1.0 K/uL   Eosinophils Relative 1 %   Eosinophils Absolute 0.1 0.0 - 0.5 K/uL   Basophils Relative 0 %   Basophils Absolute 0.0 0.0 - 0.1 K/uL   Immature Granulocytes 0 %   Abs Immature Granulocytes 0.03 0.00 - 0.07 K/uL  Troponin I (High Sensitivity)     Status: None   Collection Time: 06/28/23  5:56 PM  Result Value Ref Range   Troponin I (High Sensitivity) 5 <18 ng/L  Comprehensive metabolic panel     Status: Abnormal   Collection Time: 06/28/23  5:56 PM  Result Value Ref Range   Sodium 131 (L) 135 - 145 mmol/L   Potassium 2.8 (L) 3.5 - 5.1 mmol/L   Chloride 93 (L) 98 - 111 mmol/L   CO2 30 22 - 32 mmol/L   Glucose, Bld 101 (H) 70 - 99 mg/dL   BUN 9 6 - 20 mg/dL   Creatinine, Ser 0.86 0.61 - 1.24 mg/dL   Calcium 8.2 (L) 8.9 - 10.3 mg/dL   Total Protein 7.1 6.5 - 8.1 g/dL   Albumin 3.2 (L) 3.5 - 5.0 g/dL   AST 18 15 - 41 U/L   ALT 19 0 - 44 U/L   Alkaline Phosphatase 71 38 - 126 U/L   Total Bilirubin 0.9 <1.2 mg/dL   GFR, Estimated >57 >84 mL/min   Anion gap 8 5 - 15  Ethanol     Status: None   Collection Time: 06/28/23  5:56 PM  Result Value Ref Range   Alcohol, Ethyl (B) <10 <10 mg/dL  Resp panel by RT-PCR (RSV, Flu A&B, Covid) Anterior Nasal Swab     Status: Abnormal   Collection Time: 06/28/23  6:00 PM   Specimen: Anterior Nasal Swab  Result Value Ref Range   SARS  Coronavirus 2 by RT PCR POSITIVE (A) NEGATIVE   Influenza A by PCR NEGATIVE NEGATIVE  Influenza B by PCR NEGATIVE NEGATIVE   Resp Syncytial Virus by PCR NEGATIVE NEGATIVE  Troponin I (High Sensitivity)     Status: None   Collection Time: 06/28/23  7:38 PM  Result Value Ref Range   Troponin I (High Sensitivity) 5 <18 ng/L  Urine rapid drug screen (hosp performed)     Status: Abnormal   Collection Time: 06/28/23  7:45 PM  Result Value Ref Range   Opiates NONE DETECTED NONE DETECTED   Cocaine POSITIVE (A) NONE DETECTED   Benzodiazepines NONE DETECTED NONE DETECTED   Amphetamines NONE DETECTED NONE DETECTED   Tetrahydrocannabinol POSITIVE (A) NONE DETECTED   Barbiturates NONE DETECTED NONE DETECTED   ECG: reviwed. Sinus tach. Nonspecific st changes.  CTA chest/abd/pelvis IMPRESSION: 1. Pericardial thickening with moderate-to-large pericardial effusion. Findings are compatible with pericarditis. No CT evidence of tamponade however clinical correlation is recommended. 2. Left lower lobe bronchopneumonia and/or aspiration. Follow-up in 6-8 weeks after treatment is recommended to ensure resolution. 3. Small left pleural effusion. 4. No evidence of acute aortic syndrome. 5. Mildly dilated common bile duct measuring 10 mm. Correlate with LFTs. If abnormal consider ERCP, EUS, or MRI/MRCP.    Assessment and Plan: Left lower lobe pneumonia Covid infection. No hypoxia Pericardial effusion/ pericarditis Chest pain  Left eye trauma  - The patient does not believe in COVID and does not want any COVID medications - Echocardiogram - Cardiology consultation - He did receive antibiotics in the ED but do not plan to continue them at this time. - Consider NSAIDs for his pain.  NSAIDs are generally avoided in COVID patients but with his drug abuse this may be the best option for pain control. - Continue prescribed eye drops  - Check UA because of self reported prostatitis.   Advance Care  Planning:   Code Status: Not on file the patient wants to be DNR.  He cannot name anyone who should be called if he is incapacitated.  Consults: Cardiology  Family Communication: None  Severity of Illness: The appropriate patient status for this patient is INPATIENT. Inpatient status is judged to be reasonable and necessary in order to provide the required intensity of service to ensure the patient's safety. The patient's presenting symptoms, physical exam findings, and initial radiographic and laboratory data in the context of their chronic comorbidities is felt to place them at high risk for further clinical deterioration. Furthermore, it is not anticipated that the patient will be medically stable for discharge from the hospital within 2 midnights of admission.   * I certify that at the point of admission it is my clinical judgment that the patient will require inpatient hospital care spanning beyond 2 midnights from the point of admission due to high intensity of service, high risk for further deterioration and high frequency of surveillance required.*  Author: Buena Irish, MD 06/28/2023 8:38 PM  For on call review [email or web address].

## 2023-06-28 NOTE — ED Notes (Signed)
ED TO INPATIENT HANDOFF REPORT  ED Nurse Name and Phone #: Thamas Jaegers Name/Age/Gender Derek Scott 42 y.o. male Room/Bed: WA04/WA04  Code Status   Code Status: Not on file  Home/SNF/Other Home Patient oriented to: self, place, time, and situation Is this baseline? Yes   Triage Complete: Triage complete  Chief Complaint Pericardial effusion [I31.39]  Triage Note C/o sob and chest pain with deep inhalation that radiates to left arm and stops at elbow.  Also c/o headache, runny nose, cough, ams. Patient reports I don't remember what I did earlier and I feel like my voice is different and I don't sound like myself.  Incontinence episode x2 yesterday.    Allergies Allergies  Allergen Reactions   Bee Venom Anaphylaxis   Hydrocodone Rash   Hydrocodone-Acetaminophen Rash    Level of Care/Admitting Diagnosis ED Disposition     ED Disposition  Admit   Condition  --   Comment  Hospital Area: [facility] [[id number]]  Level of Care: Progressive [102]  Admit to Progressive based on following criteria: CARDIOVASCULAR & THORACIC of moderate stability with acute coronary syndrome symptoms/low risk myocardial infarction/hypertensive urgency/arrhythmias/heart failure potentially compromising stability and stable post cardiovascular intervention patients.  May admit patient to [facility] or [facility] if equivalent level of care is available:: Yes  Covid Evaluation: Confirmed COVID Positive  Diagnosis: Pericardial effusion [203045]  Admitting Physician: Buena Irish [[id number]]  Attending Physician: Buena Irish [id number]  Certification:: I certify this patient will need inpatient services for at least 2 midnights  Expected Medical Readiness: 07/03/2023          B Medical/Surgery History Past Medical History:  Diagnosis Date   Fractured coccyx (HCC)    4 years ago   History reviewed. No pertinent surgical history.   A IV  Location/Drains/Wounds Patient Lines/Drains/Airways Status     Active Line/Drains/Airways     Name Placement date Placement time Site Days   Peripheral IV 06/28/23 20 G Left Antecubital 06/28/23  1759  Antecubital  less than 1   Peripheral IV 06/28/23 20 G Right Antecubital 06/28/23  2040  Antecubital  less than 1            Intake/Output Last 24 hours  Intake/Output Summary (Last 24 hours) at 06/28/2023 2100 Last data filed at 06/28/2023 2053 Gross per 24 hour  Intake 340 ml  Output 900 ml  Net -560 ml    Labs/Imaging Results for orders placed or performed during the hospital encounter of 06/28/23 (from the past 48 hours)  CBC with Differential     Status: Abnormal   Collection Time: 06/28/23  5:56 PM  Result Value Ref Range   WBC 10.4 4.0 - 10.5 K/uL   RBC 4.02 (L) 4.22 - 5.81 MIL/uL   Hemoglobin 12.1 (L) 13.0 - 17.0 g/dL   HCT 96.0 (L) 45.4 - 09.8 %   MCV 87.1 80.0 - 100.0 fL   MCH 30.1 26.0 - 34.0 pg   MCHC 34.6 30.0 - 36.0 g/dL   RDW 11.9 14.7 - 82.9 %   Platelets 345 150 - 400 K/uL   nRBC 0.0 0.0 - 0.2 %   Neutrophils Relative % 74 %   Neutro Abs 7.7 1.7 - 7.7 K/uL   Lymphocytes Relative 17 %   Lymphs Abs 1.7 0.7 - 4.0 K/uL   Monocytes Relative 8 %   Monocytes Absolute 0.8 0.1 - 1.0 K/uL   Eosinophils Relative 1 %   Eosinophils Absolute 0.1  0.0 - 0.5 K/uL   Basophils Relative 0 %   Basophils Absolute 0.0 0.0 - 0.1 K/uL   Immature Granulocytes 0 %   Abs Immature Granulocytes 0.03 0.00 - 0.07 K/uL    Comment: Performed at [facility], [address], [place], [place] [address]  Troponin I (High Sensitivity)     Status: None   Collection Time: 06/28/23  5:56 PM  Result Value Ref Range   Troponin I (High Sensitivity) 5 <18 ng/L    Comment: (NOTE) Elevated high sensitivity troponin I (hsTnI) values and significant  changes across serial measurements may suggest ACS but many other  chronic and acute conditions are known to elevate hsTnI  results.  Refer to the "Links" section for chest pain algorithms and additional  guidance. Performed at [facility], [address], [place], [place] [address]   Comprehensive metabolic panel     Status: Abnormal   Collection Time: 06/28/23  5:56 PM  Result Value Ref Range   Sodium 131 (L) 135 - 145 mmol/L   Potassium 2.8 (L) 3.5 - 5.1 mmol/L   Chloride 93 (L) 98 - 111 mmol/L   CO2 30 22 - 32 mmol/L   Glucose, Bld 101 (H) 70 - 99 mg/dL    Comment: Glucose reference range applies only to samples taken after fasting for at least 8 hours.   BUN 9 6 - 20 mg/dL   Creatinine, Ser 0.25 0.61 - 1.24 mg/dL   Calcium 8.2 (L) 8.9 - 10.3 mg/dL   Total Protein 7.1 6.5 - 8.1 g/dL   Albumin 3.2 (L) 3.5 - 5.0 g/dL   AST 18 15 - 41 U/L   ALT 19 0 - 44 U/L   Alkaline Phosphatase 71 38 - 126 U/L   Total Bilirubin 0.9 <1.2 mg/dL   GFR, Estimated >85 >27 mL/min    Comment: (NOTE) Calculated using the CKD-EPI Creatinine Equation (2021)    Anion gap 8 5 - 15    Comment: Performed at [facility], [address] [place], [place] [address]  Ethanol     Status: None   Collection Time: 06/28/23  5:56 PM  Result Value Ref Range   Alcohol, Ethyl (B) <10 <10 mg/dL    Comment: (NOTE) Lowest detectable limit for serum alcohol is 10 mg/dL.  For medical purposes only. Performed at [facility], [address] [place], [place] [address]   Resp panel by RT-PCR (RSV, Flu A&B, Covid) Anterior Nasal Swab     Status: Abnormal   Collection Time: 06/28/23  6:00 PM   Specimen: Anterior Nasal Swab  Result Value Ref Range   SARS Coronavirus 2 by RT PCR POSITIVE (A) NEGATIVE    Comment: (NOTE) SARS-CoV-2 target nucleic acids are DETECTED.  The SARS-CoV-2 RNA is generally detectable in upper respiratory specimens during the acute phase of infection. Positive results are indicative of the presence of the identified virus, but do not rule out bacterial  infection or co-infection with other pathogens not detected by the test. Clinical correlation with patient history and other diagnostic information is necessary to determine patient infection status. The expected result is Negative.  Fact Sheet for Patients: BloggerCourse.com  Fact Sheet for Healthcare Providers: SeriousBroker.it  This test is not yet approved or cleared by the [place] FDA and  has been authorized for detection and/or diagnosis of SARS-CoV-2 by FDA under an Emergency Use Authorization (EUA).  This EUA will remain in effect (meaning  this test can be used) for the duration of  the COVID-19 declaration under Section 564(b)(1) of the A ct, 21 U.S.C. section 360bbb-3(b)(1), unless the authorization is terminated or revoked sooner.     Influenza A by PCR NEGATIVE NEGATIVE   Influenza B by PCR NEGATIVE NEGATIVE    Comment: (NOTE) The Xpert Xpress SARS-CoV-2/FLU/RSV plus assay is intended as an aid in the diagnosis of influenza from Nasopharyngeal swab specimens and should not be used as a sole basis for treatment. Nasal washings and aspirates are unacceptable for Xpert Xpress SARS-CoV-2/FLU/RSV testing.  Fact Sheet for Patients: BloggerCourse.com  Fact Sheet for Healthcare Providers: SeriousBroker.it  This test is not yet approved or cleared by the [place] FDA and has been authorized for detection and/or diagnosis of SARS-CoV-2 by FDA under an Emergency Use Authorization (EUA). This EUA will remain in effect (meaning this test can be used) for the duration of the COVID-19 declaration under Section 564(b)(1) of the Act, 21 U.S.C. section 360bbb-3(b)(1), unless the authorization is terminated or revoked.     Resp Syncytial Virus by PCR NEGATIVE NEGATIVE    Comment: (NOTE) Fact Sheet for Patients: BloggerCourse.com  Fact Sheet  for Healthcare Providers: SeriousBroker.it  This test is not yet approved or cleared by the [place] FDA and has been authorized for detection and/or diagnosis of SARS-CoV-2 by FDA under an Emergency Use Authorization (EUA). This EUA will remain in effect (meaning this test can be used) for the duration of the COVID-19 declaration under Section 564(b)(1) of the Act, 21 U.S.C. section 360bbb-3(b)(1), unless the authorization is terminated or revoked.  Performed at [facility], [address], [place], [place] [address]   Troponin I (High Sensitivity)     Status: None   Collection Time: 06/28/23  7:38 PM  Result Value Ref Range   Troponin I (High Sensitivity) 5 <18 ng/L    Comment: (NOTE) Elevated high sensitivity troponin I (hsTnI) values and significant  changes across serial measurements may suggest ACS but many other  chronic and acute conditions are known to elevate hsTnI results.  Refer to the "Links" section for chest pain algorithms and additional  guidance. Performed at [facility], [address] [place], [place] [address]   Urine rapid drug screen (hosp performed)     Status: Abnormal   Collection Time: 06/28/23  7:45 PM  Result Value Ref Range   Opiates NONE DETECTED NONE DETECTED   Cocaine POSITIVE (A) NONE DETECTED   Benzodiazepines NONE DETECTED NONE DETECTED   Amphetamines NONE DETECTED NONE DETECTED   Tetrahydrocannabinol POSITIVE (A) NONE DETECTED   Barbiturates NONE DETECTED NONE DETECTED    Comment: (NOTE) DRUG SCREEN FOR MEDICAL PURPOSES ONLY.  IF CONFIRMATION IS NEEDED FOR ANY PURPOSE, NOTIFY LAB WITHIN 5 DAYS.  LOWEST DETECTABLE LIMITS FOR URINE DRUG SCREEN Drug Class                     Cutoff (ng/mL) Amphetamine and metabolites    1000 Barbiturate and metabolites    200 Benzodiazepine                 200 Opiates and metabolites        300 Cocaine and metabolites         300 THC                            50 Performed at [facility], [address] [place], [place] [address]   I-Stat Lactic Acid     Status: None   Collection Time: 06/28/23  8:48 PM  Result Value Ref Range   Lactic Acid, Venous 1.7 0.5 - 1.9 mmol/L   CT Angio Chest/Abd/Pel for Dissection W and/or Wo Contrast Result Date: 06/28/2023 CLINICAL DATA:  Shortness of breath and chest pain with deep inspiration radiating to the left arm. Acute aortic syndrome suspected. Reported cocaine use. EXAM: CT ANGIOGRAPHY CHEST, ABDOMEN AND PELVIS TECHNIQUE: Non-contrast CT of the chest was initially obtained. Multidetector CT imaging through the chest, abdomen and pelvis was performed using the standard protocol during bolus administration of intravenous contrast. Multiplanar reconstructed images and MIPs were obtained and reviewed to evaluate the vascular anatomy. RADIATION DOSE REDUCTION: This exam was performed according to the departmental dose-optimization program which includes automated exposure control, adjustment of the mA and/or kV according to patient size and/or use of iterative reconstruction technique. CONTRAST:  OMNIPAQUE IOHEXOL 350 MG/ML SOLN COMPARISON:  Chest radiograph earlier today FINDINGS: CTA CHEST FINDINGS Cardiovascular: Pericardial thickening with moderate-to-large pericardial effusion. Findings are compatible with pericarditis. No CT evidence of tamponade however clinical correlation is recommended. Normal heart size. Normal caliber thoracic aorta without penetrating atherosclerotic ulcer, intramural hematoma, or dissection. Mediastinum/Nodes: Trachea and esophagus are unremarkable. No thoracic adenopathy. Lungs/Pleura: Consolidation and ground-glass opacities in the left lower lobe associated with bronchial wall thickening and mucous plugging. Additional airspace opacities in the posterior right lower lobe and lingula, favor atelectasis. Paraseptal emphysema in the  apices. Small left pleural effusion. No pneumothorax. Musculoskeletal: No acute fracture. Review of the MIP images confirms the above findings. CTA ABDOMEN AND PELVIS FINDINGS VASCULAR The aorta and its mesenteric, renal, and iliac artery branches are widely patent without aneurysm or dissection. Review of the MIP images confirms the above findings. NON-VASCULAR Hepatobiliary: The common bile duct is mildly dilated measuring 10 mm. Normal gallbladder without radiopaque stone. No arterial enhancing hepatic lesion. Pancreas: Unremarkable. No pancreatic ductal dilatation or surrounding inflammatory changes. Spleen: Unremarkable. Adrenals/Urinary Tract: Normal adrenal glands. No urinary calculi or hydronephrosis. Unremarkable bladder. Stomach/Bowel: Normal caliber large and small bowel. No bowel wall thickening. Normal appendix. Stomach is within normal limits. Lymphatic: No lymphadenopathy. Reproductive: Unremarkable. Other: No free intraperitoneal fluid or air. Musculoskeletal: No acute fracture. Review of the MIP images confirms the above findings. IMPRESSION: 1. Pericardial thickening with moderate-to-large pericardial effusion. Findings are compatible with pericarditis. No CT evidence of tamponade however clinical correlation is recommended. 2. Left lower lobe bronchopneumonia and/or aspiration. Follow-up in 6-8 weeks after treatment is recommended to ensure resolution. 3. Small left pleural effusion. 4. No evidence of acute aortic syndrome. 5. Mildly dilated common bile duct measuring 10 mm. Correlate with LFTs. If abnormal consider ERCP, EUS, or MRI/MRCP. Aortic Atherosclerosis (ICD10-I70.0) and Emphysema (ICD10-J43.9). Electronically Signed   By: Minerva Fester M.D.   On: 06/28/2023 19:33   CT Head Wo Contrast Result Date: 06/28/2023 CLINICAL DATA:  Altered mental status EXAM: CT HEAD WITHOUT CONTRAST TECHNIQUE: Contiguous axial images were obtained from the base of the skull through the vertex without  intravenous contrast. RADIATION DOSE REDUCTION: This exam was performed according to the departmental dose-optimization program which includes automated exposure control, adjustment of the mA and/or kV according to patient size and/or use of iterative reconstruction technique. COMPARISON:  None Available. FINDINGS: Brain: No evidence of acute infarction, hemorrhage, hydrocephalus, extra-axial collection or mass lesion/mass effect. Vascular: No hyperdense vessel or unexpected calcification. Skull: Normal. Negative  for fracture or focal lesion. Sinuses/Orbits: No acute finding. Other: Mastoid air cells and middle ear cavities are clear. IMPRESSION: 1. No acute intracranial abnormality. Electronically Signed   By: Helyn Numbers M.D.   On: 06/28/2023 19:23   DG Chest Port 1 View Result Date: 06/28/2023 CLINICAL DATA:  Shortness of breath and chest pain EXAM: PORTABLE CHEST 1 VIEW COMPARISON:  08/11/2018 FINDINGS: Enlarged cardiomediastinal silhouette. Retrocardiac atelectasis or consolidation. No pleural effusion or pneumothorax. No displaced rib fractures. IMPRESSION: Retrocardiac atelectasis or pneumonia. Enlarged cardiomediastinal silhouette, new since 08/11/2018. Pericardial effusion is not excluded. Consider CT for further evaluation. Electronically Signed   By: Minerva Fester M.D.   On: 06/28/2023 18:22    Pending Labs Unresulted Labs (From admission, onward)     Start     Ordered   06/29/23 0500  HIV Antibody (routine testing w rflx)  (HIV Antibody (Routine testing w reflex) panel)  Tomorrow morning,   R        06/28/23 2033   06/29/23 0500  Basic metabolic panel  Tomorrow morning,   R        06/28/23 2033   06/29/23 0500  CBC  Tomorrow morning,   R        06/28/23 2033   06/28/23 2033  Magnesium  Add-on,   AD        06/28/23 2032   06/28/23 1948  Culture, blood (routine x 2)  BLOOD CULTURE X 2,   R      06/28/23 1949            Vitals/Pain Today's Vitals   06/28/23 1915 06/28/23  1930 06/28/23 2001 06/28/23 2031  BP: 117/81 107/73 116/81 122/79  Pulse: 90 88 86 86  Resp: (!) 21 20 (!) 22 20  Temp:   98.4 F (36.9 C)   TempSrc:      SpO2: 99% 95% 98% 97%  Weight:      Height:      PainSc:        Isolation Precautions No active isolations  Medications Medications  cefTRIAXone (ROCEPHIN) 1 g in sodium chloride 0.9 % 100 mL IVPB (1 g Intravenous New Bag/Given 06/28/23 2044)  azithromycin (ZITHROMAX) 500 mg in sodium chloride 0.9 % 250 mL IVPB (has no administration in time range)  potassium chloride 10 mEq in 100 mL IVPB (has no administration in time range)  potassium chloride 10 mEq in 100 mL IVPB (0 mEq Intravenous Stopped 06/28/23 2045)  iohexol (OMNIPAQUE) 350 MG/ML injection 100 mL (100 mLs Intravenous Contrast Given 06/28/23 1903)    Mobility walks     Focused Assessments     R Recommendations: See Admitting Provider Note  Report given to:   Additional Notes:

## 2023-06-29 ENCOUNTER — Other Ambulatory Visit (HOSPITAL_COMMUNITY): Payer: BC Managed Care – PPO

## 2023-06-29 DIAGNOSIS — I3139 Other pericardial effusion (noninflammatory): Secondary | ICD-10-CM | POA: Diagnosis not present

## 2023-06-29 DIAGNOSIS — I301 Infective pericarditis: Secondary | ICD-10-CM

## 2023-06-29 DIAGNOSIS — R0789 Other chest pain: Secondary | ICD-10-CM

## 2023-06-29 DIAGNOSIS — J189 Pneumonia, unspecified organism: Secondary | ICD-10-CM

## 2023-06-29 LAB — CBC
HCT: 33 % — ABNORMAL LOW (ref 39.0–52.0)
Hemoglobin: 10.7 g/dL — ABNORMAL LOW (ref 13.0–17.0)
MCH: 29.2 pg (ref 26.0–34.0)
MCHC: 32.4 g/dL (ref 30.0–36.0)
MCV: 89.9 fL (ref 80.0–100.0)
Platelets: 305 10*3/uL (ref 150–400)
RBC: 3.67 MIL/uL — ABNORMAL LOW (ref 4.22–5.81)
RDW: 13.5 % (ref 11.5–15.5)
WBC: 7.5 10*3/uL (ref 4.0–10.5)
nRBC: 0 % (ref 0.0–0.2)

## 2023-06-29 LAB — URINALYSIS, W/ REFLEX TO CULTURE (INFECTION SUSPECTED)
Bilirubin Urine: NEGATIVE
Glucose, UA: NEGATIVE mg/dL
Ketones, ur: NEGATIVE mg/dL
Leukocytes,Ua: NEGATIVE
Nitrite: NEGATIVE
Protein, ur: NEGATIVE mg/dL
Specific Gravity, Urine: 1.011 (ref 1.005–1.030)
pH: 6 (ref 5.0–8.0)

## 2023-06-29 LAB — BASIC METABOLIC PANEL
Anion gap: 5 (ref 5–15)
BUN: 7 mg/dL (ref 6–20)
CO2: 29 mmol/L (ref 22–32)
Calcium: 7.7 mg/dL — ABNORMAL LOW (ref 8.9–10.3)
Chloride: 99 mmol/L (ref 98–111)
Creatinine, Ser: 0.68 mg/dL (ref 0.61–1.24)
GFR, Estimated: >60 mL/min (ref >60)
Glucose, Bld: 98 mg/dL (ref 70–99)
Potassium: 3.3 mmol/L — ABNORMAL LOW (ref 3.5–5.1)
Sodium: 133 mmol/L — ABNORMAL LOW (ref 135–145)

## 2023-06-29 LAB — HIV ANTIBODY (ROUTINE TESTING W REFLEX): HIV Screen 4th Generation wRfx: NONREACTIVE

## 2023-06-29 LAB — C-REACTIVE PROTEIN: CRP: 18.1 mg/dL — ABNORMAL HIGH (ref <1.0)

## 2023-06-29 LAB — SEDIMENTATION RATE: Sed Rate: 55 mm/h — ABNORMAL HIGH (ref 0–16)

## 2023-06-29 MED ORDER — IBUPROFEN 800 MG PO TABS
800.0000 mg | ORAL_TABLET | Freq: Three times a day (TID) | ORAL | Status: DC
  Administered 2023-06-29: 800 mg via ORAL
  Filled 2023-06-29: qty 1

## 2023-06-29 MED ORDER — POTASSIUM CHLORIDE 20 MEQ PO PACK
40.0000 meq | PACK | Freq: Once | ORAL | Status: AC
  Administered 2023-06-29: 40 meq via ORAL
  Filled 2023-06-29: qty 2

## 2023-06-29 MED ORDER — IBUPROFEN 800 MG PO TABS: 800.0000 mg | ORAL_TABLET | Freq: Three times a day (TID) | ORAL | Status: DC

## 2023-06-29 MED ORDER — POTASSIUM CHLORIDE CRYS ER 20 MEQ PO TBCR: 40.0000 meq | EXTENDED_RELEASE_TABLET | Freq: Once | ORAL | Status: DC

## 2023-06-29 MED ORDER — COLCHICINE 0.6 MG PO TABS
0.6000 mg | ORAL_TABLET | Freq: Two times a day (BID) | ORAL | Status: DC
  Administered 2023-06-29: 0.6 mg via ORAL
  Filled 2023-06-29: qty 1

## 2023-06-29 MED ORDER — PANTOPRAZOLE SODIUM 40 MG PO TBEC
40.0000 mg | DELAYED_RELEASE_TABLET | Freq: Every day | ORAL | Status: DC
  Administered 2023-06-29: 40 mg via ORAL
  Filled 2023-06-29: qty 1

## 2023-06-29 NOTE — ED Notes (Signed)
This writer went to give pt lunch to him, pt was not in the room. RN and Tech for this assignment notified.

## 2023-06-29 NOTE — ED Notes (Signed)
Patient not in room. Noted gown on bed. Presumed AMA. Unsure if IV heplocks removed. No evidence in room. Charge Nurse informed. Provider informed.

## 2023-06-29 NOTE — Consult Note (Addendum)
Cardiology Consultation   Patient ID: Derek Scott MRN: [id number]; DOB: 1980-12-04  Admit date: 06/28/2023 Date of Consult: 06/29/2023  PCP:  Patient, No Pcp Per   [facility] Providers Cardiologist:  None   {  Patient Profile:   Derek Scott is a 42 y.o. male with a hx of cocaine use who is being seen 06/29/2023 for the evaluation of pericardial effusion at the request of Dr. Isidoro Donning.  History of Present Illness:   Derek Scott has no prior cardiac history.  He reports family history of multiple family members with heart attacks.  He smokes 3 cigarettes a day.  Active cocaine user.  Currently patient being evaluated for chest pain with moderate to large pericardial effusion noted on CTA.  No evidence of tamponade.  Negative troponins x 2.  Patient reports for the past 1 week that he has had severe pleuritic chest pain with deep inhalations.  Reported that the pain got so severe that he urinated on himself yesterday.  Reports some radiating pain down the shoulder into his left arm but worse positionally.  He reports no cough, congestion, fever, chills.  Has some peripheral edema occasionally, no orthopnea.  Did report headache with some muscle myalgias.  Although he is COVID-positive here he denies this test is being accurate.  Vital signs are stable.  He is not tachycardic.  CTA also showed He had left lower lobe bronchopneumonia and/or aspiration, no dissection.  Mild dilated common bile duct measuring 10 mm. Lactic acid negative.  UDS positive for THC and cocaine.  Hemoglobin 10.7.  Potassium 3.3.  Sodium 133.  Normal WBC.   Past Medical History:  Diagnosis Date   Fractured coccyx (HCC)    4 years ago    History reviewed. No pertinent surgical history.   Inpatient Medications: Scheduled Meds:  ketorolac  30 mg Intravenous Once   Continuous Infusions:  PRN Meds:   Allergies:    Allergies  Allergen Reactions   Bee Venom Anaphylaxis   Hydrocodone  Rash   Hydrocodone-Acetaminophen Rash    Social History:   Social History   Socioeconomic History   Marital status: Single    Spouse name: Not on file   Number of children: Not on file   Years of education: Not on file   Highest education level: Not on file  Occupational History   Not on file  Tobacco Use   Smoking status: Every Day    Current packs/day: 2.00    Types: Cigarettes   Smokeless tobacco: Never  Substance and Sexual Activity   Alcohol use: No   Drug use: Yes    Types: Marijuana   Sexual activity: Not on file  Other Topics Concern   Not on file  Social History Narrative   Not on file   Social Drivers of Health   Financial Resource Strain: Not on file  Food Insecurity: Not on file  Transportation Needs: Not on file  Physical Activity: Not on file  Stress: Not on file  Social Connections: Not on file  Intimate Partner Violence: Not on file    Family History:   Family History  Problem Relation Age of Onset   Hypertension Mother    Diabetes Mother    Cancer Mother      ROS:  Please see the history of present illness.  All other ROS reviewed and negative.     Physical Exam/Data:   Vitals:   06/29/23 0400 06/29/23 0600 06/29/23 0700 06/29/23 0730  BP: 100/63 108/72 114/71   Pulse: 70 68 68   Resp: 18 20 18    Temp:    98.1 F (36.7 C)  TempSrc:    Oral  SpO2: 93% 94% 99%   Weight:      Height:        Intake/Output Summary (Last 24 hours) at 06/29/2023 0924 Last data filed at 06/29/2023 0115 Gross per 24 hour  Intake 1111.74 ml  Output 900 ml  Net 211.74 ml      06/28/2023    5:41 PM 06/06/2023    1:45 PM 10/10/2022    6:24 PM  Last 3 Weights  Weight (lbs) 198 lb 6.6 oz 200 lb 190 lb  Weight (kg) 90 kg 90.719 kg 86.183 kg     Body mass index is 26.18 kg/m.  General:  Well nourished, well developed, in no acute distress HEENT: normal Neck:  JVD Vascular: No carotid bruits; Distal pulses 2+ bilaterally Cardiac:  normal S1, S2;  RRR; no murmur. Lungs: Crackles Abd: soft, nontender, no hepatomegaly  Ext: no edema Musculoskeletal:  No deformities, BUE and BLE strength normal and equal Skin: warm and dry  Neuro:  CNs 2-12 intact, no focal abnormalities noted Psych:  Normal affect   EKG:  The EKG was personally reviewed and demonstrates: Sinus tachycardia, heart rate 115.  PVCs.  No acute ST-T wave changes.  No PR depressions. Telemetry:  Telemetry was personally reviewed and demonstrates: Sinus rhythm, heart rate in the 80s.  Low voltage  Relevant CV Studies:   Laboratory Data:  High Sensitivity Troponin:   Recent Labs  Lab 06/28/23 1756 06/28/23 1938  TROPONINIHS 5 5     Chemistry Recent Labs  Lab 06/28/23 1756 06/28/23 1938 06/29/23 0507  NA 131*  --  133*  K 2.8*  --  3.3*  CL 93*  --  99  CO2 30  --  29  GLUCOSE 101*  --  98  BUN 9  --  7  CREATININE 0.72  --  0.68  CALCIUM 8.2*  --  7.7*  MG  --  2.2  --   GFRNONAA >60  --  >60  ANIONGAP 8  --  5    Recent Labs  Lab 06/28/23 1756  PROT 7.1  ALBUMIN 3.2*  AST 18  ALT 19  ALKPHOS 71  BILITOT 0.9   Lipids No results for input(s): "CHOL", "TRIG", "HDL", "LABVLDL", "LDLCALC", "CHOLHDL" in the last 168 hours.  Hematology Recent Labs  Lab 06/28/23 1756 06/29/23 0507  WBC 10.4 7.5  RBC 4.02* 3.67*  HGB 12.1* 10.7*  HCT 35.0* 33.0*  MCV 87.1 89.9  MCH 30.1 29.2  MCHC 34.6 32.4  RDW 13.5 13.5  PLT 345 305   Thyroid No results for input(s): "TSH", "FREET4" in the last 168 hours.  BNPNo results for input(s): "BNP", "PROBNP" in the last 168 hours.  DDimer No results for input(s): "DDIMER" in the last 168 hours.   Radiology/Studies:  CT Angio Chest/Abd/Pel for Dissection W and/or Wo Contrast Result Date: 06/28/2023 CLINICAL DATA:  Shortness of breath and chest pain with deep inspiration radiating to the left arm. Acute aortic syndrome suspected. Reported cocaine use. EXAM: CT ANGIOGRAPHY CHEST, ABDOMEN AND PELVIS TECHNIQUE:  Non-contrast CT of the chest was initially obtained. Multidetector CT imaging through the chest, abdomen and pelvis was performed using the standard protocol during bolus administration of intravenous contrast. Multiplanar reconstructed images and MIPs were obtained and reviewed to evaluate the vascular anatomy.  RADIATION DOSE REDUCTION: This exam was performed according to the departmental dose-optimization program which includes automated exposure control, adjustment of the mA and/or kV according to patient size and/or use of iterative reconstruction technique. CONTRAST:  OMNIPAQUE IOHEXOL 350 MG/ML SOLN COMPARISON:  Chest radiograph earlier today FINDINGS: CTA CHEST FINDINGS Cardiovascular: Pericardial thickening with moderate-to-large pericardial effusion. Findings are compatible with pericarditis. No CT evidence of tamponade however clinical correlation is recommended. Normal heart size. Normal caliber thoracic aorta without penetrating atherosclerotic ulcer, intramural hematoma, or dissection. Mediastinum/Nodes: Trachea and esophagus are unremarkable. No thoracic adenopathy. Lungs/Pleura: Consolidation and ground-glass opacities in the left lower lobe associated with bronchial wall thickening and mucous plugging. Additional airspace opacities in the posterior right lower lobe and lingula, favor atelectasis. Paraseptal emphysema in the apices. Small left pleural effusion. No pneumothorax. Musculoskeletal: No acute fracture. Review of the MIP images confirms the above findings. CTA ABDOMEN AND PELVIS FINDINGS VASCULAR The aorta and its mesenteric, renal, and iliac artery branches are widely patent without aneurysm or dissection. Review of the MIP images confirms the above findings. NON-VASCULAR Hepatobiliary: The common bile duct is mildly dilated measuring 10 mm. Normal gallbladder without radiopaque stone. No arterial enhancing hepatic lesion. Pancreas: Unremarkable. No pancreatic ductal dilatation or  surrounding inflammatory changes. Spleen: Unremarkable. Adrenals/Urinary Tract: Normal adrenal glands. No urinary calculi or hydronephrosis. Unremarkable bladder. Stomach/Bowel: Normal caliber large and small bowel. No bowel wall thickening. Normal appendix. Stomach is within normal limits. Lymphatic: No lymphadenopathy. Reproductive: Unremarkable. Other: No free intraperitoneal fluid or air. Musculoskeletal: No acute fracture. Review of the MIP images confirms the above findings. IMPRESSION: 1. Pericardial thickening with moderate-to-large pericardial effusion. Findings are compatible with pericarditis. No CT evidence of tamponade however clinical correlation is recommended. 2. Left lower lobe bronchopneumonia and/or aspiration. Follow-up in 6-8 weeks after treatment is recommended to ensure resolution. 3. Small left pleural effusion. 4. No evidence of acute aortic syndrome. 5. Mildly dilated common bile duct measuring 10 mm. Correlate with LFTs. If abnormal consider ERCP, EUS, or MRI/MRCP. Aortic Atherosclerosis (ICD10-I70.0) and Emphysema (ICD10-J43.9). Electronically Signed   By: Minerva Fester M.D.   On: 06/28/2023 19:33   CT Head Wo Contrast Result Date: 06/28/2023 CLINICAL DATA:  Altered mental status EXAM: CT HEAD WITHOUT CONTRAST TECHNIQUE: Contiguous axial images were obtained from the base of the skull through the vertex without intravenous contrast. RADIATION DOSE REDUCTION: This exam was performed according to the departmental dose-optimization program which includes automated exposure control, adjustment of the mA and/or kV according to patient size and/or use of iterative reconstruction technique. COMPARISON:  None Available. FINDINGS: Brain: No evidence of acute infarction, hemorrhage, hydrocephalus, extra-axial collection or mass lesion/mass effect. Vascular: No hyperdense vessel or unexpected calcification. Skull: Normal. Negative for fracture or focal lesion. Sinuses/Orbits: No acute finding.  Other: Mastoid air cells and middle ear cavities are clear. IMPRESSION: 1. No acute intracranial abnormality. Electronically Signed   By: Helyn Numbers M.D.   On: 06/28/2023 19:23   DG Chest Port 1 View Result Date: 06/28/2023 CLINICAL DATA:  Shortness of breath and chest pain EXAM: PORTABLE CHEST 1 VIEW COMPARISON:  08/11/2018 FINDINGS: Enlarged cardiomediastinal silhouette. Retrocardiac atelectasis or consolidation. No pleural effusion or pneumothorax. No displaced rib fractures. IMPRESSION: Retrocardiac atelectasis or pneumonia. Enlarged cardiomediastinal silhouette, new since 08/11/2018. Pericardial effusion is not excluded. Consider CT for further evaluation. Electronically Signed   By: Minerva Fester M.D.   On: 06/28/2023 18:22     Assessment and Plan:   Pericardial effusion secondary to  pericarditis COVID-positive  Describing 1 week of pleuritic severe chest pain.  COVID-positive.  Bedside echocardiogram shows likely moderate circumferential pericardial effusion.  Reviewed images with MD, no tamponade physiology.  Vital signs stable.  Not tachycardic, EKG showing no PR depressions or ST elevation, troponins negative. Start colchicine 0.6 mg twice daily for the next 2-3 months.  Ibuprofen 800 mg 3 times daily likely for 1 to 2 weeks. Pantoprazole 40mg  daily.  Cautious use with diuretics.  Will check ESR/CRP to help guide medical therapy duration.  These will need to be repeated outpatient. Echocardiogram pending.  Cocaine use Nicotine dependence Recommended cessation.   Bronchopneumonia  Noted on CTA per primary team       Risk Assessment/Risk Scores:      For questions or updates, please contact [facility] Please consult [email or web address] for contact info under    Signed, Abagail Kitchens, PA-C  06/29/2023 9:24 AM

## 2023-06-29 NOTE — Progress Notes (Signed)
PT Cancellation Note  Patient Details Name: SUN AFIFI MRN: [id number] DOB: 08-Jul-1981   Cancelled Treatment:    Reason Eval/Treat Not Completed: Patient not medically ready. PT will follow and evaluate when medically ready.  Blanchard Kelch PT Acute Rehabilitation Services Office [phone] Weekend [phone]    Rada Hay 06/29/2023, 7:23 AM

## 2023-06-29 NOTE — ED Notes (Signed)
In to round on patient and update vitals. Patient not in room. Echo ordered. Will look in to location.

## 2023-06-29 NOTE — Discharge Summary (Signed)
AMA NOTE    Patient ID: GIBBS STROUGH MRN: [id number] DOB/AGE: [birth year] 42 y.o.  Admit date: 06/28/2023 Discharge date: 06/29/2023   PLEASE NOTE THAT PATIENT LEFT AGAINST MEDICAL ADVICE.    Primary Care Physician:  Patient, No Pcp Per  Discharge Diagnoses:     Pericardial effusion  Pneumonia due to COVID-19 virus  COVID-19 virus infection Hypokalemia Cocaine abuse Tobacco abuse  Consults: Cardiology   Recommendations for Outpatient Follow-up:    TESTS THAT NEED FOLLOW-UP Patient eft AMA      Allergies:   Allergies  Allergen Reactions   Bee Venom Anaphylaxis   Hydrocodone Rash   Hydrocodone-Acetaminophen Rash     Discharge Medications: Please note that patient left AMA (against medical advice)   Brief H and P: For complete details please refer to admission H and P, but in brief patient is a 42 year old male with history of cocaine use presented with severe pleuritic chest pain with deep inhalation, shortness of breath.  Patient reported that he had significant pain on the day of admission that he urinated on himself, radiating down the shoulder onto his left arm.  No cough, congestion, fevers or chills.  He was found to be COVID-positive however he denied that the test was accurate as he had no COVID symptoms.  CTA showed left lower lobe bronchopneumonia and/or aspiration, no dissection. UDS positive for THC and cocaine.  Cardiology was consulted  Hospital Course:     Pericardial effusion with chest pain likely due to pericarditis, COVID-positive -Patient reported 1 week of significant pleuritic severe chest pain.  CTA showed left lower lobe bronchopneumonia, no dissection. -Cardiology was consulted, recommended starting colchicine, ibuprofen, pantoprazole -2D echo pending    Pneumonia due to COVID-19 virus -COVID test was positive however patient adamantly denied that he had COVID and the test was inaccurate.  Cocaine use, nicotine  dependence -Patient was counseled on cessation   Around 4 PM today, I was notified that patient had eloped from the ER.  He had removed his telemetry monitor strips, BP cuff and was not found in the room. ER staff also called him and his mother with no response.     Day of Discharge Patient was seen this morning when he was complaining of significant pleuritic chest pain radiating down to the shoulder and arm.  BP 109/67   Pulse 70   Temp 98.1 F (36.7 C) (Oral)   Resp 18   Ht 6\' 1"  (1.854 m)   Wt 90 kg   SpO2 98%   BMI 26.18 kg/m   Physical Exam: This morning around 7:30 AM General: Alert and awake oriented x3 HEENT: anicteric sclera, pupils reactive to light and accommodation CVS: S1-S2 clear no murmur rubs or gallops Chest: clear to auscultation bilaterally, no wheezing rales or rhonchi Abdomen: soft nontender, nondistended, normal bowel sounds Extremities: No pedal edema bilaterally Neuro: Cranial nerves II-XII intact, no focal neurological deficits   The results of significant diagnostics from this hospitalization (including imaging, microbiology, ancillary and laboratory) are listed below for reference.    LAB RESULTS: Basic Metabolic Panel: Recent Labs  Lab 06/28/23 1756 06/28/23 1938 06/29/23 0507  NA 131*  --  133*  K 2.8*  --  3.3*  CL 93*  --  99  CO2 30  --  29  GLUCOSE 101*  --  98  BUN 9  --  7  CREATININE 0.72  --  0.68  CALCIUM 8.2*  --  7.7*  MG  --  2.2  --    Liver Function Tests: Recent Labs  Lab 06/28/23 1756  AST 18  ALT 19  ALKPHOS 71  BILITOT 0.9  PROT 7.1  ALBUMIN 3.2*   No results for input(s): "LIPASE", "AMYLASE" in the last 168 hours. No results for input(s): "AMMONIA" in the last 168 hours. CBC: Recent Labs  Lab 06/28/23 1756 06/29/23 0507  WBC 10.4 7.5  NEUTROABS 7.7  --   HGB 12.1* 10.7*  HCT 35.0* 33.0*  MCV 87.1 89.9  PLT 345 305   Cardiac Enzymes: No results for input(s): "CKTOTAL", "CKMB", "CKMBINDEX",  "TROPONINI" in the last 168 hours. BNP: Invalid input(s): "POCBNP" CBG: No results for input(s): "GLUCAP" in the last 168 hours.  Significant Diagnostic Studies:  CT Angio Chest/Abd/Pel for Dissection W and/or Wo Contrast Result Date: 06/28/2023 CLINICAL DATA:  Shortness of breath and chest pain with deep inspiration radiating to the left arm. Acute aortic syndrome suspected. Reported cocaine use. EXAM: CT ANGIOGRAPHY CHEST, ABDOMEN AND PELVIS TECHNIQUE: Non-contrast CT of the chest was initially obtained. Multidetector CT imaging through the chest, abdomen and pelvis was performed using the standard protocol during bolus administration of intravenous contrast. Multiplanar reconstructed images and MIPs were obtained and reviewed to evaluate the vascular anatomy. RADIATION DOSE REDUCTION: This exam was performed according to the departmental dose-optimization program which includes automated exposure control, adjustment of the mA and/or kV according to patient size and/or use of iterative reconstruction technique. CONTRAST:  OMNIPAQUE IOHEXOL 350 MG/ML SOLN COMPARISON:  Chest radiograph earlier today FINDINGS: CTA CHEST FINDINGS Cardiovascular: Pericardial thickening with moderate-to-large pericardial effusion. Findings are compatible with pericarditis. No CT evidence of tamponade however clinical correlation is recommended. Normal heart size. Normal caliber thoracic aorta without penetrating atherosclerotic ulcer, intramural hematoma, or dissection. Mediastinum/Nodes: Trachea and esophagus are unremarkable. No thoracic adenopathy. Lungs/Pleura: Consolidation and ground-glass opacities in the left lower lobe associated with bronchial wall thickening and mucous plugging. Additional airspace opacities in the posterior right lower lobe and lingula, favor atelectasis. Paraseptal emphysema in the apices. Small left pleural effusion. No pneumothorax. Musculoskeletal: No acute fracture. Review of the MIP  images confirms the above findings. CTA ABDOMEN AND PELVIS FINDINGS VASCULAR The aorta and its mesenteric, renal, and iliac artery branches are widely patent without aneurysm or dissection. Review of the MIP images confirms the above findings. NON-VASCULAR Hepatobiliary: The common bile duct is mildly dilated measuring 10 mm. Normal gallbladder without radiopaque stone. No arterial enhancing hepatic lesion. Pancreas: Unremarkable. No pancreatic ductal dilatation or surrounding inflammatory changes. Spleen: Unremarkable. Adrenals/Urinary Tract: Normal adrenal glands. No urinary calculi or hydronephrosis. Unremarkable bladder. Stomach/Bowel: Normal caliber large and small bowel. No bowel wall thickening. Normal appendix. Stomach is within normal limits. Lymphatic: No lymphadenopathy. Reproductive: Unremarkable. Other: No free intraperitoneal fluid or air. Musculoskeletal: No acute fracture. Review of the MIP images confirms the above findings. IMPRESSION: 1. Pericardial thickening with moderate-to-large pericardial effusion. Findings are compatible with pericarditis. No CT evidence of tamponade however clinical correlation is recommended. 2. Left lower lobe bronchopneumonia and/or aspiration. Follow-up in 6-8 weeks after treatment is recommended to ensure resolution. 3. Small left pleural effusion. 4. No evidence of acute aortic syndrome. 5. Mildly dilated common bile duct measuring 10 mm. Correlate with LFTs. If abnormal consider ERCP, EUS, or MRI/MRCP. Aortic Atherosclerosis (ICD10-I70.0) and Emphysema (ICD10-J43.9). Electronically Signed   By: Minerva Fester M.D.   On: 06/28/2023 19:33   CT Head Wo Contrast Result Date: 06/28/2023 CLINICAL DATA:  Altered mental status EXAM: CT  HEAD WITHOUT CONTRAST TECHNIQUE: Contiguous axial images were obtained from the base of the skull through the vertex without intravenous contrast. RADIATION DOSE REDUCTION: This exam was performed according to the departmental  dose-optimization program which includes automated exposure control, adjustment of the mA and/or kV according to patient size and/or use of iterative reconstruction technique. COMPARISON:  None Available. FINDINGS: Brain: No evidence of acute infarction, hemorrhage, hydrocephalus, extra-axial collection or mass lesion/mass effect. Vascular: No hyperdense vessel or unexpected calcification. Skull: Normal. Negative for fracture or focal lesion. Sinuses/Orbits: No acute finding. Other: Mastoid air cells and middle ear cavities are clear. IMPRESSION: 1. No acute intracranial abnormality. Electronically Signed   By: Helyn Numbers M.D.   On: 06/28/2023 19:23   DG Chest Port 1 View Result Date: 06/28/2023 CLINICAL DATA:  Shortness of breath and chest pain EXAM: PORTABLE CHEST 1 VIEW COMPARISON:  08/11/2018 FINDINGS: Enlarged cardiomediastinal silhouette. Retrocardiac atelectasis or consolidation. No pleural effusion or pneumothorax. No displaced rib fractures. IMPRESSION: Retrocardiac atelectasis or pneumonia. Enlarged cardiomediastinal silhouette, new since 08/11/2018. Pericardial effusion is not excluded. Consider CT for further evaluation. Electronically Signed   By: Minerva Fester M.D.   On: 06/28/2023 18:22    2D ECHO:   Disposition and Follow-up:    DISPOSITION: Patient left AMA.       Signed:   Thad Ranger M.D. [facility] 06/29/2023, 4:09 PM

## 2023-06-29 NOTE — ED Notes (Signed)
Attempted to call pt and his mother without answer. Writer did leave message for him to call the ED.

## 2023-06-29 NOTE — Progress Notes (Signed)
OT Cancellation Note  Patient Details Name: Derek Scott MRN: [id number] DOB: 08-13-1980   Cancelled Treatment:    Reason Eval/Treat Not Completed: Patient not medically ready OT to continue to follow and check back as schedule will allow Rosalio Loud, MS Acute Rehabilitation Department Office# [phone]  06/29/2023, 9:30 AM

## 2023-06-30 ENCOUNTER — Telehealth: Payer: Self-pay | Admitting: Cardiology

## 2023-06-30 NOTE — Telephone Encounter (Signed)
Left message to call back  

## 2023-06-30 NOTE — Telephone Encounter (Signed)
Hi this patient left AMA yesterday and has a moderate sized pericardial effusion that should be followed. Can you attempt to call him and document whether or not your reach him. Schedule him follow up if he's willing please. Thanks.

## 2023-07-01 NOTE — Telephone Encounter (Signed)
Left message for patient to call back  

## 2023-07-03 LAB — CULTURE, BLOOD (ROUTINE X 2)
Culture: NO GROWTH
Culture: NO GROWTH

## 2023-07-18 ENCOUNTER — Emergency Department (HOSPITAL_COMMUNITY)
Admission: EM | Admit: 2023-07-18 | Discharge: 2023-07-18 | Disposition: A | Discharge status: Home / Self Care | Payer: Self-pay | Attending: Emergency Medicine | Admitting: Emergency Medicine

## 2023-07-18 ENCOUNTER — Emergency Department (HOSPITAL_COMMUNITY): Payer: Self-pay

## 2023-07-18 ENCOUNTER — Encounter (HOSPITAL_COMMUNITY): Payer: Self-pay

## 2023-07-18 ENCOUNTER — Other Ambulatory Visit: Payer: Self-pay

## 2023-07-18 DIAGNOSIS — R0602 Shortness of breath: Secondary | ICD-10-CM | POA: Diagnosis not present

## 2023-07-18 DIAGNOSIS — R0789 Other chest pain: Secondary | ICD-10-CM

## 2023-07-18 DIAGNOSIS — D72829 Elevated white blood cell count, unspecified: Secondary | ICD-10-CM | POA: Diagnosis not present

## 2023-07-18 LAB — CBC
HCT: 45 % (ref 39.0–52.0)
Hemoglobin: 15 g/dL (ref 13.0–17.0)
MCH: 29.1 pg (ref 26.0–34.0)
MCHC: 33.3 g/dL (ref 30.0–36.0)
MCV: 87.2 fL (ref 80.0–100.0)
Platelets: 412 10*3/uL — ABNORMAL HIGH (ref 150–400)
RBC: 5.16 MIL/uL (ref 4.22–5.81)
RDW: 14 % (ref 11.5–15.5)
WBC: 12.4 10*3/uL — ABNORMAL HIGH (ref 4.0–10.5)
nRBC: 0 % (ref 0.0–0.2)

## 2023-07-18 LAB — BASIC METABOLIC PANEL
Anion gap: 11 (ref 5–15)
BUN: 10 mg/dL (ref 6–20)
CO2: 26 mmol/L (ref 22–32)
Calcium: 9.7 mg/dL (ref 8.9–10.3)
Chloride: 101 mmol/L (ref 98–111)
Creatinine, Ser: 0.8 mg/dL (ref 0.61–1.24)
GFR, Estimated: >60 mL/min (ref >60)
Glucose, Bld: 95 mg/dL (ref 70–99)
Potassium: 3.9 mmol/L (ref 3.5–5.1)
Sodium: 138 mmol/L (ref 135–145)

## 2023-07-18 LAB — TROPONIN I (HIGH SENSITIVITY): Troponin I (High Sensitivity): 3 ng/L (ref <18)

## 2023-07-18 MED ORDER — FAMOTIDINE 20 MG PO TABS
20.0000 mg | ORAL_TABLET | Freq: Once | ORAL | Status: DC
Start: 2023-07-18 — End: 2023-07-19
  Filled 2023-07-18: qty 1

## 2023-07-18 MED ORDER — LIDOCAINE HCL (PF) 1 % IJ SOLN
INTRAMUSCULAR | Status: AC
  Administered 2023-07-18: 1 mL
  Filled 2023-07-18: qty 5

## 2023-07-18 MED ORDER — IBUPROFEN 800 MG PO TABS
800.0000 mg | ORAL_TABLET | Freq: Once | ORAL | Status: DC
  Filled 2023-07-18: qty 1

## 2023-07-18 MED ORDER — AZITHROMYCIN 250 MG PO TABS
500.0000 mg | ORAL_TABLET | Freq: Once | ORAL | Status: AC
  Administered 2023-07-18: 500 mg via ORAL
  Filled 2023-07-18: qty 2

## 2023-07-18 MED ORDER — AZITHROMYCIN 250 MG PO TABS: 250.0000 mg | ORAL_TABLET | Freq: Every day | ORAL | 0 refills | Status: AC

## 2023-07-18 MED ORDER — CEFTRIAXONE SODIUM 500 MG IJ SOLR
500.0000 mg | Freq: Once | INTRAMUSCULAR | Status: AC
  Administered 2023-07-18: 500 mg via INTRAMUSCULAR
  Filled 2023-07-18: qty 500

## 2023-07-18 NOTE — ED Provider Notes (Addendum)
 [place] [facility] AT [facility] Provider Note   CSN: [id number] Arrival date & time: 07/18/23  1449     History  Chief Complaint  Patient presents with   Chest Pain   HPI GEOVANNY SARTIN is a 43 y.o. male with recent history of pneumonia with associated pericardial effusion presenting for chest pain.  States chest pain has been going on for a month.  Describes it as a squeezing pain.  Located in the left chest at times radiates to the left shoulder.  Can be worse with exertion.  It is nonpleuritic. States the pain right now is a 5/10 but can be much worse at times. States he was planned to be admitted to the hospital for pneumonia pericardial effusion but he eloped from the ER without treatment.  States he no longer has a cough or fever but does report intermittent shortness of breath.  Patient also states that he has been homeless for the last 3 weeks.  Denies recent cocaine use.   Chest Pain      Home Medications Prior to Admission medications   Medication Sig Start Date End Date Taking? Authorizing Provider  azithromycin  (ZITHROMAX ) 250 MG tablet Take 1 tablet (250 mg total) by mouth daily. Take first 2 tablets together, then 1 every day until finished. 07/18/23  Yes Greidys Deland K, PA-C  cyclopentolate  (CYCLODRYL,CYCLOGYL ) 1 % ophthalmic solution Place 1 drop into the left eye 2 (two) times daily as needed (eye pain). 06/06/23   Freddi Hamilton, MD  prednisoLONE  acetate (PREDNISOLONE  ACETATE P-F) 1 % ophthalmic suspension Place 1 drop into the left eye 4 (four) times daily. Do not use for more than 10 days. 06/06/23   Freddi Hamilton, MD      Allergies    Bee venom, Hydrocodone, and Hydrocodone-acetaminophen     Review of Systems   Review of Systems  Cardiovascular:  Positive for chest pain.    Physical Exam Updated Vital Signs BP 116/83 (BP Location: Left Arm)   Pulse 94   Temp 98.3 F (36.8 C) (Oral)   Resp 18   Ht 6' 1 (1.854 m)   Wt 83  kg   SpO2 94%   BMI 24.14 kg/m  Physical Exam Vitals and nursing note reviewed.  HENT:     Head: Normocephalic and atraumatic.     Mouth/Throat:     Mouth: Mucous membranes are moist.  Eyes:     General:        Right eye: No discharge.        Left eye: No discharge.     Conjunctiva/sclera: Conjunctivae normal.  Cardiovascular:     Rate and Rhythm: Regular rhythm. Tachycardia present.     Pulses: Normal pulses.     Heart sounds: Normal heart sounds.  Pulmonary:     Effort: Pulmonary effort is normal.     Breath sounds: Normal breath sounds.  Abdominal:     General: Abdomen is flat.     Palpations: Abdomen is soft.  Skin:    General: Skin is warm and dry.  Neurological:     General: No focal deficit present.  Psychiatric:        Mood and Affect: Mood normal.     ED Results / Procedures / Treatments   Labs (all labs ordered are listed, but only abnormal results are displayed) Labs Reviewed  CBC - Abnormal; Notable for the following components:      Result Value   WBC 12.4 (*)  Platelets 412 (*)    All other components within normal limits  BASIC METABOLIC PANEL  TROPONIN I (HIGH SENSITIVITY)  TROPONIN I (HIGH SENSITIVITY)    EKG EKG Interpretation Date/Time:  Saturday July 18 2023 15:40:25 EST Ventricular Rate:  130 PR Interval:  156 QRS Duration:  86 QT Interval:  298 QTC Calculation: 438 R Axis:   79  Text Interpretation: Sinus tachycardia Biatrial enlargement T wave abnormality, consider inferior ischemia Abnormal ECG When compared with ECG of 28-Jun-2023 17:33, PREVIOUS ECG IS PRESENT Confirmed by Jerral Meth [phone]) on 07/18/2023 6:10:28 PM  Radiology DG Chest 2 View Result Date: 07/18/2023 CLINICAL DATA:  Chest pain with shortness of breath for 1 month. Recent pneumonia. EXAM: CHEST - 2 VIEW COMPARISON:  Radiographs and CT 06/28/2023. FINDINGS: Interval decreased size of the cardiac silhouette, now within normal limits. Interval improved  aeration of the left lung base with mild residual streaky opacity and a possible small adjacent left pleural effusion. The right lung is clear. There is no pneumothorax. The bones appear unchanged. IMPRESSION: 1. Interval improved aeration of the left lung base with mild residual streaky opacity and possible small left pleural effusion. No residual consolidation. 2. Interval decreased size of the cardiac silhouette in this patient previously demonstrated to have a pericardial effusion. 3. No new findings seen. Electronically Signed   By: Elsie Perone M.D.   On: 07/18/2023 16:38    Procedures Procedures    Medications Ordered in ED Medications  ibuprofen  (ADVIL ) tablet 800 mg (800 mg Oral Patient Refused/Not Given 07/18/23 1823)  famotidine  (PEPCID ) tablet 20 mg (20 mg Oral Patient Refused/Not Given 07/18/23 1824)  cefTRIAXone  (ROCEPHIN ) injection 500 mg (500 mg Intramuscular Given 07/18/23 1847)  azithromycin  (ZITHROMAX ) tablet 500 mg (500 mg Oral Given 07/18/23 1846)  lidocaine  (PF) (XYLOCAINE ) 1 % injection (1 mL  Given 07/18/23 1848)    ED Course/ Medical Decision Making/ A&P                                 Medical Decision Making Amount and/or Complexity of Data Reviewed Labs: ordered. Radiology: ordered.  Risk Prescription drug management.   Initial Impression and Ddx 43 year old well-appearing male presenting for chest pain.  Exam was unremarkable.  DDx includes ACS, PE, pneumonia, pneumothorax, pericarditis, other. Patient PMH that increases complexity of ED encounter:  recent history of pneumonia with associated pericardial effusion  Interpretation of Diagnostics - I independent reviewed and interpreted the labs as followed: Leukocytosis (12.4)  - I independently visualized the following imaging with scope of interpretation limited to determining acute life threatening conditions related to emergency care: CXR, which revealed 1. Interval improved aeration of the left lung base  with mild residual streaky opacity and possible small left pleural effusion. No residual consolidation. 2. Interval decreased size of the cardiac silhouette in this patient previously demonstrated to have a pericardial effusion. 3. No new findings seen.  - I personally reviewed and interpreted EKG which revealed sinus tachycardia  Patient Reassessment and Ultimate Disposition/Management Per chart review, learned that he was admitted for pneumonia and associated pericardial effusion but eloped shortly after admission.  Workup today suggestive that symptoms have improved overall.  He still has a residual leukocytosis and left-sided symptoms with possible residual pneumonia as well given findings on x-ray.  Overall he is improved significantly from a symptomatic standpoint.  Will plan to treat him for pneumonia and have him follow-up with his  PCP.  Patient refused Tylenol  for his chest pain.  Recommended Tylenol  ibuprofen  for his intermittent chest pain.  Doubt ACS and PE given reassuring workup.  Discussed return precaution.  Vies to follow-up PCP.  Discharged good condition.  Patient management required discussion with the following services or consulting groups:  None  Complexity of Problems Addressed Acute complicated illness or Injury  Additional Data Reviewed and Analyzed Further history obtained from: Further history from spouse/family member, Past medical history and medications listed in the EMR, and Prior ED visit notes  Patient Encounter Risk Assessment Prescriptions     Final Clinical Impression(s) / ED Diagnoses Final diagnoses:  Atypical chest pain    Rx / DC Orders ED Discharge Orders          Ordered    azithromycin  (ZITHROMAX ) 250 MG tablet  Daily        07/18/23 1816              Lang Norleen POUR, PA-C 07/18/23 1859    Lang Norleen POUR, PA-C 07/18/23 1900    Jerral Meth, MD 07/19/23 0007

## 2023-07-18 NOTE — ED Triage Notes (Signed)
 Pt reports squeezing CP with exertion x1 month. Pt reports that he was recently hospitalized for pna. Pt also endorses [facility].

## 2023-07-18 NOTE — ED Notes (Signed)
 EDP discussing with pt the importance to finish up abx to fight the residual PNA seen on Xray.

## 2023-07-18 NOTE — Discharge Instructions (Signed)
 Evaluation today was overall reassuring.  From your x-ray today it does appear you may have some residual pneumonia.  I am treating you with azithromycin .  Please take the entire course even if you are feeling better.  For your chest pain recommend Tylenol  and ibuprofen .  Of course if your symptoms get worse, you have shortness of breath, cough and fever or any other concern please return to the emergency department further evaluation.  Otherwise please follow-up with your PCP.

## 2023-09-02 ENCOUNTER — Ambulatory Visit: Payer: Medicaid Other | Admitting: Internal Medicine

## 2023-09-06 NOTE — Progress Notes (Deleted)
 NO SHOW

## 2023-09-08 ENCOUNTER — Ambulatory Visit: Payer: Medicaid Other | Attending: Cardiovascular Disease | Admitting: Cardiovascular Disease

## 2023-09-08 DIAGNOSIS — I3139 Other pericardial effusion (noninflammatory): Secondary | ICD-10-CM

## 2023-09-08 DIAGNOSIS — I301 Infective pericarditis: Secondary | ICD-10-CM

## 2023-09-08 DIAGNOSIS — U071 COVID-19: Secondary | ICD-10-CM
